# Patient Record
Sex: Female | Born: 1985
Health system: Southern US, Community
[De-identification: ages and names within clinical notes are randomized; demographics above are authoritative.]

## PROBLEM LIST (undated history)

## (undated) DIAGNOSIS — O039 Complete or unspecified spontaneous abortion without complication: Secondary | ICD-10-CM

## (undated) DIAGNOSIS — I341 Nonrheumatic mitral (valve) prolapse: Secondary | ICD-10-CM

## (undated) DIAGNOSIS — N809 Endometriosis, unspecified: Secondary | ICD-10-CM

## (undated) DIAGNOSIS — F419 Anxiety disorder, unspecified: Secondary | ICD-10-CM

## (undated) DIAGNOSIS — R011 Cardiac murmur, unspecified: Secondary | ICD-10-CM

## (undated) DIAGNOSIS — IMO0002 Reserved for concepts with insufficient information to code with codable children: Secondary | ICD-10-CM

## (undated) DIAGNOSIS — R87619 Unspecified abnormal cytological findings in specimens from cervix uteri: Secondary | ICD-10-CM

## (undated) HISTORY — DX: Endometriosis, unspecified: N80.9

## (undated) HISTORY — PX: WISDOM TOOTH EXTRACTION: SHX21

## (undated) HISTORY — DX: Nonrheumatic mitral (valve) prolapse: I34.1

## (undated) HISTORY — DX: Reserved for concepts with insufficient information to code with codable children: IMO0002

## (undated) HISTORY — DX: Anxiety disorder, unspecified: F41.9

## (undated) HISTORY — DX: Cardiac murmur, unspecified: R01.1

## (undated) HISTORY — DX: Complete or unspecified spontaneous abortion without complication: O03.9

## (undated) HISTORY — DX: Unspecified abnormal cytological findings in specimens from cervix uteri: R87.619

## (undated) HISTORY — PX: CERVICAL BIOPSY  W/ LOOP ELECTRODE EXCISION: SUR135

---
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
## 2010-11-25 ENCOUNTER — Inpatient Hospital Stay (INDEPENDENT_AMBULATORY_CARE_PROVIDER_SITE_OTHER)
Admission: RE | Admit: 2010-11-25 | Discharge: 2010-11-25 | Disposition: A | Discharge status: Home / Self Care | Payer: Federal, State, Local not specified - PPO | Source: Ambulatory Visit | Attending: Family Medicine | Admitting: Family Medicine

## 2010-11-25 DIAGNOSIS — T6391XA Toxic effect of contact with unspecified venomous animal, accidental (unintentional), initial encounter: Secondary | ICD-10-CM

## 2011-05-23 NOTE — L&D Delivery Note (Signed)
Delivery Note  Pt continued pushing well w good progress, FHR remained reassuring w early variables.   At 4:54 AM a viable female was delivered via Vaginal, Spontaneous Delivery (Presentation: ; Occiput Anterior).  Shoulders delivered easily, no respiratory effort, cord immediately clamped and cut and infant taken to warmer, NICU team called to room. APGAR: 1, 6; weight 8 lb 7.9 oz (3853 g).   Placenta status: Intact, Spontaneous.  Cord: 3 vessels with the following complications: None.  Cord pH: 7.211, 7.110   Anesthesia: Epidural, Local  Episiotomy: None Lacerations: 2nd degree Suture Repair: 3.0 vicryl rapide Est. Blood Loss (mL): 250cc   Mom to postpartum.  Baby to NICU. Pt plans to BF   Koleson Reifsteck M 04/11/2012, 6:08 AM

## 2011-08-07 ENCOUNTER — Encounter (INDEPENDENT_AMBULATORY_CARE_PROVIDER_SITE_OTHER): Payer: Federal, State, Local not specified - PPO | Admitting: Obstetrics and Gynecology

## 2011-08-07 DIAGNOSIS — Z01419 Encounter for gynecological examination (general) (routine) without abnormal findings: Secondary | ICD-10-CM

## 2011-08-07 DIAGNOSIS — Z202 Contact with and (suspected) exposure to infections with a predominantly sexual mode of transmission: Secondary | ICD-10-CM

## 2011-08-07 DIAGNOSIS — O09299 Supervision of pregnancy with other poor reproductive or obstetric history, unspecified trimester: Secondary | ICD-10-CM

## 2011-08-07 DIAGNOSIS — N912 Amenorrhea, unspecified: Secondary | ICD-10-CM

## 2011-08-09 ENCOUNTER — Other Ambulatory Visit (INDEPENDENT_AMBULATORY_CARE_PROVIDER_SITE_OTHER): Payer: Federal, State, Local not specified - PPO

## 2011-08-09 DIAGNOSIS — Z3480 Encounter for supervision of other normal pregnancy, unspecified trimester: Secondary | ICD-10-CM

## 2011-08-14 ENCOUNTER — Ambulatory Visit: Payer: Self-pay | Admitting: Obstetrics and Gynecology

## 2011-08-17 ENCOUNTER — Encounter: Payer: Federal, State, Local not specified - PPO | Attending: Obstetrics and Gynecology | Admitting: Dietician

## 2011-08-17 ENCOUNTER — Encounter (INDEPENDENT_AMBULATORY_CARE_PROVIDER_SITE_OTHER): Payer: Federal, State, Local not specified - PPO

## 2011-08-17 DIAGNOSIS — Z713 Dietary counseling and surveillance: Secondary | ICD-10-CM | POA: Insufficient documentation

## 2011-08-17 DIAGNOSIS — Z331 Pregnant state, incidental: Secondary | ICD-10-CM

## 2011-08-17 DIAGNOSIS — E639 Nutritional deficiency, unspecified: Secondary | ICD-10-CM | POA: Insufficient documentation

## 2011-08-17 DIAGNOSIS — O99891 Other specified diseases and conditions complicating pregnancy: Secondary | ICD-10-CM | POA: Insufficient documentation

## 2011-08-17 LAB — OB RESULTS CONSOLE HEPATITIS B SURFACE ANTIGEN: Hepatitis B Surface Ag: NEGATIVE

## 2011-08-17 LAB — OB RESULTS CONSOLE GC/CHLAMYDIA
Chlamydia: NEGATIVE
Gonorrhea: NEGATIVE

## 2011-08-17 LAB — OB RESULTS CONSOLE RUBELLA ANTIBODY, IGM: Rubella: IMMUNE

## 2011-08-17 LAB — OB RESULTS CONSOLE ANTIBODY SCREEN: Antibody Screen: NEGATIVE

## 2011-08-17 LAB — OB RESULTS CONSOLE RPR: RPR: NONREACTIVE

## 2011-08-17 LAB — OB RESULTS CONSOLE HGB/HCT, BLOOD: HCT: 47 %

## 2011-08-17 LAB — OB RESULTS CONSOLE HIV ANTIBODY (ROUTINE TESTING): HIV: NONREACTIVE

## 2011-08-17 NOTE — Patient Instructions (Addendum)
   Try a small amount of juice in water to help with taste  Use the bland and salty,(tortilla chips, the gold fish).  Use foods that sound like they would taste good.  Try for small frequent snacks  Try some dry roasted nuts, continue the yogurt, try the pudding in a small amount.   Try for some lower impact exercise each day.  Listen to your body and let it direct your exercise.  Continue to aim for at least 1800-2000 calories when you are able to eat.  Remember that you can loose the weight when the baby comes.  Use a protein source such as cheese, nuts, chicken, [place].  Use the regular salad dressings.  Fat in first or second trimester, but hold back on fat in the 3rd.

## 2011-08-17 NOTE — Progress Notes (Signed)
Medical Nutrition Therapy:  Appt start time: 1500 end time:  1600.  Assessment:  Primary concerns today: Currently is [redacted] weeks pregnant following a miscarriage in 03/2011.  She is in [employer] and is stationed in [place].  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  She reports that she has always tended to weight more and to maintain a BMI at a high normal.  While in service, she has tried to stay physically active and to keep her BMI well below the 25 kg/m2.  Now that she is pregnant, she is unsure what the service Amanda [name] say.  She is striving to maintain a healthy weight and have a healthy baby, but not gain a great deal of weight during the pregnancy, that she will need to loose following the delivery.  She is currently experiencing daily bouts of nausea and some vomiting.  Her eating patterns have changed a great deal.  Many of her usual foods are unappealing at this time.  She is experiencing a marked decrease in her energy levels and is frustrated with all of the many changes at one time.  We spent sometime discussing the changes that are normal to the first trimester and how the pregnancy Dmarius Reeder flow and how each woman can have her own unique physical responses to the pregnancy.  MEDICATIONS: Reconciliation of medications completed  DIETARY INTAKE:  24-hr recall:  B (6:00 AM): Banana on the way to work.  8:00 Oatmeal, instant packet, and cup of fruit (packaged) packed in juice or clementine.  Water or green tea. On the way to work a PB and jelly sandwich . And using the PB sandwich rather than the oatmeal. Snk (mid AM) : Special K pastry (100 calories) and water   L (12-1:00 PM): Soup chicken noodle (plain) or a [place] sandwich.  Clementine or fruit pack. Or all natural fruit bar at 70 calories  Water.  Snk (mid PM): 100 calorie pack of cookies or crackers. D (7:00 PM): Chicken or steak, or vegetable (broccoli, corn, green beans, or baked beans) 1-2 servings of starch.  Snk (Bedtime  PM): Fruit bar Beverages: water, green tea  Recent physical activity: spin class 2 times per week, kick boxing class 1 time per week and running 3 times per week but, this has not happened in the last couple of weeks. She is finding that her energy level is markedly decreased and the nausea is also limiting her activity.    Estimated energy needs: Ht;  65.5 in  WT: 142 lb (64.5 Kg)  BMI:23.3 kg/m2   1800-2000 calories At this point with the nausea, she needs all that her body will allow her to take in.  Progress Towards Goal(s):  In progress.   Nutritional Diagnosis:  NB-1.1 Food and nutrition-related knowledge deficit As related to the nurtritional needs for pregnancy .  As evidenced by initially in pregnancy limiting energy intake and maintaining exercise increased exercise levels, and currently experiencing increased nausea and decreased appetiti.  Intervention:  Nutrition We completed a review of the normal nutrient needs for pregnancy and followed with a review of how the appetite changes with the nausea and vomiting.  We reviewed strategies for keeping her energy intake up while dealing with the nausea.  She had a number of questions, and appeared to be seeking assurance that she was using the safest and best approaches to her current situation.  Handouts given during visit include:  Eating Expectantly  List of the Non-Starchy vegetables  Handout for Nausea During Pregnancy  Patient Instructions:  Try a small amount of juice in water to help with taste  Use the bland and salty,(tortilla chips, the gold fish).  Use foods that sound like they would taste good.  Try for small frequent snacks  Try some dry roasted nuts, continue the yogurt, try the pudding in a small amount.   Try for some lower impact exercise each day.  Listen to your body and let it direct your exercise.  Continue to aim for at least 1800-2000 calories when you are able to eat.  Remember that you can  loose the weight when the baby comes.  Use a protein source such as cheese, nuts, chicken, [place].  Use the regular salad dressings.  Fat in first or second trimester, but hold back on fat in the 3rd.  Monitoring/Evaluation:  Dietary intake, exercise, and body weight in mid-second trimester.  She is to call or e-mail with questions and make a follow-up appointment.

## 2011-08-22 ENCOUNTER — Encounter: Payer: Self-pay | Admitting: Dietician

## 2011-08-23 ENCOUNTER — Encounter (INDEPENDENT_AMBULATORY_CARE_PROVIDER_SITE_OTHER): Payer: Federal, State, Local not specified - PPO | Admitting: Obstetrics and Gynecology

## 2011-08-23 ENCOUNTER — Other Ambulatory Visit (INDEPENDENT_AMBULATORY_CARE_PROVIDER_SITE_OTHER): Payer: Federal, State, Local not specified - PPO

## 2011-08-23 DIAGNOSIS — Z331 Pregnant state, incidental: Secondary | ICD-10-CM

## 2011-09-20 ENCOUNTER — Encounter: Payer: Self-pay | Admitting: Obstetrics and Gynecology

## 2011-09-20 ENCOUNTER — Ambulatory Visit (INDEPENDENT_AMBULATORY_CARE_PROVIDER_SITE_OTHER): Payer: Federal, State, Local not specified - PPO | Admitting: Obstetrics and Gynecology

## 2011-09-20 VITALS — BP 100/66 | Wt 145.0 lb

## 2011-09-20 DIAGNOSIS — Z331 Pregnant state, incidental: Secondary | ICD-10-CM | POA: Insufficient documentation

## 2011-09-20 NOTE — Progress Notes (Signed)
Complains of vaginal itching.  Wet prep shows yeast.  Monistat recommended.  Genetic screening discussed.  Patient wants first trimester screen.  Return to office in 1 week.  Dr. Stefano Gaul

## 2011-09-20 NOTE — Progress Notes (Signed)
PT C/O ITCHING AND BURNING X 2 DAYS ? YEAST  LM

## 2011-09-27 ENCOUNTER — Encounter: Payer: Self-pay | Admitting: Obstetrics and Gynecology

## 2011-09-27 DIAGNOSIS — N939 Abnormal uterine and vaginal bleeding, unspecified: Secondary | ICD-10-CM

## 2011-09-28 ENCOUNTER — Encounter: Payer: Federal, State, Local not specified - PPO | Admitting: Obstetrics and Gynecology

## 2011-09-29 ENCOUNTER — Other Ambulatory Visit: Payer: Self-pay | Admitting: Obstetrics and Gynecology

## 2011-09-29 ENCOUNTER — Ambulatory Visit (INDEPENDENT_AMBULATORY_CARE_PROVIDER_SITE_OTHER): Payer: Federal, State, Local not specified - PPO

## 2011-09-29 DIAGNOSIS — Z331 Pregnant state, incidental: Secondary | ICD-10-CM

## 2011-10-03 ENCOUNTER — Ambulatory Visit (INDEPENDENT_AMBULATORY_CARE_PROVIDER_SITE_OTHER): Payer: Federal, State, Local not specified - PPO | Admitting: Obstetrics and Gynecology

## 2011-10-03 VITALS — BP 100/60 | Wt 144.0 lb

## 2011-10-03 DIAGNOSIS — Z331 Pregnant state, incidental: Secondary | ICD-10-CM

## 2011-10-03 NOTE — Progress Notes (Signed)
The patient reports vaginal itching.  She was nauseated okay to use Monistat on the vulva and vagina.  The patient was told that it is appropriate.  Ultrasound on Sep 29, 2011 showed a 12 week and 6 day gestation with a normal first trimester screen.  Return to office in 4 weeks.  Dr. Stefano Gaul

## 2011-10-03 NOTE — Progress Notes (Signed)
Pt still thinks she may have yeast infection with vaginal itching

## 2011-11-01 ENCOUNTER — Encounter: Payer: Self-pay | Admitting: Obstetrics and Gynecology

## 2011-11-01 ENCOUNTER — Ambulatory Visit (INDEPENDENT_AMBULATORY_CARE_PROVIDER_SITE_OTHER): Payer: Federal, State, Local not specified - PPO | Admitting: Obstetrics and Gynecology

## 2011-11-01 VITALS — BP 96/54 | Wt 147.0 lb

## 2011-11-01 DIAGNOSIS — Z331 Pregnant state, incidental: Secondary | ICD-10-CM

## 2011-11-01 DIAGNOSIS — Z139 Encounter for screening, unspecified: Secondary | ICD-10-CM

## 2011-11-01 NOTE — Progress Notes (Signed)
Pt without complaint today

## 2011-11-01 NOTE — Progress Notes (Signed)
Doing well. Declines genetic testing. Ultrasound next visit. AVS

## 2011-11-01 NOTE — Addendum Note (Signed)
Addended by: Janine Limbo on: 11/01/2011 05:23 PM   Modules accepted: Orders

## 2011-11-15 ENCOUNTER — Ambulatory Visit (INDEPENDENT_AMBULATORY_CARE_PROVIDER_SITE_OTHER): Payer: Federal, State, Local not specified - PPO | Admitting: Obstetrics and Gynecology

## 2011-11-15 ENCOUNTER — Ambulatory Visit (INDEPENDENT_AMBULATORY_CARE_PROVIDER_SITE_OTHER): Payer: Federal, State, Local not specified - PPO

## 2011-11-15 ENCOUNTER — Encounter: Payer: Self-pay | Admitting: Obstetrics and Gynecology

## 2011-11-15 VITALS — BP 120/58 | Wt 150.0 lb

## 2011-11-15 DIAGNOSIS — Z331 Pregnant state, incidental: Secondary | ICD-10-CM

## 2011-11-15 DIAGNOSIS — Z3689 Encounter for other specified antenatal screening: Secondary | ICD-10-CM

## 2011-11-15 DIAGNOSIS — Z139 Encounter for screening, unspecified: Secondary | ICD-10-CM

## 2011-11-15 NOTE — Progress Notes (Signed)
No complaints

## 2011-11-15 NOTE — Progress Notes (Signed)
Doing well. Ultrasound: Single gestation, normal fluid, normal anatomy, female, cervix 3.91 cm, posterior placenta. Return to office in 4 weeks. Dr. Stefano Gaul

## 2011-11-21 LAB — US OB COMP + 14 WK

## 2011-12-14 ENCOUNTER — Ambulatory Visit (INDEPENDENT_AMBULATORY_CARE_PROVIDER_SITE_OTHER): Payer: Federal, State, Local not specified - PPO | Admitting: Obstetrics and Gynecology

## 2011-12-14 VITALS — BP 100/54 | Wt 154.0 lb

## 2011-12-14 DIAGNOSIS — Z331 Pregnant state, incidental: Secondary | ICD-10-CM

## 2011-12-14 NOTE — Progress Notes (Signed)
Doing well. Return to office in 4 weeks. Glucola next visit. Dr. Stefano Gaul

## 2011-12-14 NOTE — Progress Notes (Signed)
Pt states she has no concerns today.  

## 2012-01-05 ENCOUNTER — Ambulatory Visit (INDEPENDENT_AMBULATORY_CARE_PROVIDER_SITE_OTHER): Payer: Federal, State, Local not specified - PPO | Admitting: Obstetrics and Gynecology

## 2012-01-05 ENCOUNTER — Encounter: Payer: Self-pay | Admitting: Obstetrics and Gynecology

## 2012-01-05 ENCOUNTER — Telehealth: Payer: Self-pay | Admitting: Obstetrics and Gynecology

## 2012-01-05 VITALS — BP 114/68 | Wt 156.0 lb

## 2012-01-05 DIAGNOSIS — Z3490 Encounter for supervision of normal pregnancy, unspecified, unspecified trimester: Secondary | ICD-10-CM

## 2012-01-05 DIAGNOSIS — R0602 Shortness of breath: Secondary | ICD-10-CM

## 2012-01-05 DIAGNOSIS — D649 Anemia, unspecified: Secondary | ICD-10-CM

## 2012-01-05 DIAGNOSIS — O368190 Decreased fetal movements, unspecified trimester, not applicable or unspecified: Secondary | ICD-10-CM

## 2012-01-05 DIAGNOSIS — Z331 Pregnant state, incidental: Secondary | ICD-10-CM

## 2012-01-05 LAB — CBC
HCT: 37.9 % (ref 36.0–46.0)
MCH: 31.2 pg (ref 26.0–34.0)
MCHC: 35.4 g/dL (ref 30.0–36.0)
MCV: 88.1 fL (ref 78.0–100.0)
Platelets: 372 10*3/uL (ref 150–400)
RDW: 13.1 % (ref 11.5–15.5)
WBC: 13.2 10*3/uL — ABNORMAL HIGH (ref 4.0–10.5)

## 2012-01-05 MED ORDER — FERRALET 90 90-1 MG PO TABS: 1.0000 | ORAL_TABLET | Freq: Every day | ORAL | Status: DC

## 2012-01-05 NOTE — Telephone Encounter (Signed)
Spoke with pt rgd phone call. Pt stated that she is [redacted]w[redacted]d pregnant . Pt stated has not been felling well for the past 2 days. Pt stated that her heart been feeling that it been beating very fast/ shortness of breath and numbness in her hands  Feeling nauseous and faint  .+ fm ,16fever,0 edema.Pt's next app is on 01/11/2012 with AVS advised pt would consult with the midwife oncall and would call pt back . Pt's voice understanding .

## 2012-01-05 NOTE — Progress Notes (Signed)
[redacted]w[redacted]d Patient has called early complaining that she had not felt the baby move and she was breathless on exertion. NST:155 bpm Cat 1, FM++ noted           Had uterine irritability and after hydration settled.           Advised re hydration.           Anemia in the past. CBC pending.           To commence on Floridix Liquid Iron BID po.            ROB in 2 weeks or as needed.

## 2012-01-05 NOTE — Progress Notes (Signed)
Pt declines 1 gtt today not feeling well C/o DEC FM

## 2012-01-05 NOTE — Progress Notes (Signed)
Pt c/o SOB and feeling faint x 3 days

## 2012-01-11 ENCOUNTER — Other Ambulatory Visit: Payer: Federal, State, Local not specified - PPO

## 2012-01-11 ENCOUNTER — Encounter: Payer: Self-pay | Admitting: Obstetrics and Gynecology

## 2012-01-11 ENCOUNTER — Ambulatory Visit (INDEPENDENT_AMBULATORY_CARE_PROVIDER_SITE_OTHER): Payer: Federal, State, Local not specified - PPO | Admitting: Obstetrics and Gynecology

## 2012-01-11 VITALS — BP 100/68 | Wt 160.0 lb

## 2012-01-11 DIAGNOSIS — Z331 Pregnant state, incidental: Secondary | ICD-10-CM

## 2012-01-11 NOTE — Progress Notes (Signed)
Pt states no concerns today.   

## 2012-01-11 NOTE — Progress Notes (Signed)
[redacted]w[redacted]d Glucola, hemoglobin, RPR today. Childbirth classes, breast feeding, contraception, car seat, preterm labor discussed. A positive. Return office in 2 weeks. Dr. Stefano Gaul

## 2012-01-12 LAB — GLUCOSE TOLERANCE, 1 HOUR (50G) W/O FASTING: Glucose, 1 Hour GTT: 107 mg/dL (ref 70–140)

## 2012-01-25 ENCOUNTER — Ambulatory Visit (INDEPENDENT_AMBULATORY_CARE_PROVIDER_SITE_OTHER): Payer: Federal, State, Local not specified - PPO | Admitting: Obstetrics and Gynecology

## 2012-01-25 VITALS — BP 98/68 | Wt 164.0 lb

## 2012-01-25 DIAGNOSIS — Z3490 Encounter for supervision of normal pregnancy, unspecified, unspecified trimester: Secondary | ICD-10-CM

## 2012-01-25 DIAGNOSIS — Z331 Pregnant state, incidental: Secondary | ICD-10-CM

## 2012-01-25 NOTE — Progress Notes (Signed)
[redacted]w[redacted]d Had been feeling unwell at last visit and was diagnosed with anemia. Completed 2 weeks of Floridix Iron Supplement po. Feeling well with return of energy levels. FM+ No change vaginal secretions. ROB x 2 weeks

## 2012-02-08 ENCOUNTER — Ambulatory Visit (INDEPENDENT_AMBULATORY_CARE_PROVIDER_SITE_OTHER): Payer: Federal, State, Local not specified - PPO | Admitting: Obstetrics and Gynecology

## 2012-02-08 VITALS — BP 100/62 | Wt 167.0 lb

## 2012-02-08 DIAGNOSIS — Z331 Pregnant state, incidental: Secondary | ICD-10-CM

## 2012-02-08 NOTE — Progress Notes (Signed)
[redacted]w[redacted]d doing well. Return office in 2 weeks. Dr. Stefano Gaul

## 2012-02-21 ENCOUNTER — Ambulatory Visit (INDEPENDENT_AMBULATORY_CARE_PROVIDER_SITE_OTHER): Payer: Federal, State, Local not specified - PPO | Admitting: Obstetrics and Gynecology

## 2012-02-21 VITALS — BP 102/60 | Wt 167.0 lb

## 2012-02-21 DIAGNOSIS — Z331 Pregnant state, incidental: Secondary | ICD-10-CM

## 2012-02-21 NOTE — Progress Notes (Signed)
[redacted]w[redacted]d Doing well. Return to office in 2 weeks. Dr. Stefano Gaul

## 2012-02-21 NOTE — Progress Notes (Signed)
[redacted]w[redacted]d  Pt has no concerns today.

## 2012-03-06 ENCOUNTER — Ambulatory Visit (INDEPENDENT_AMBULATORY_CARE_PROVIDER_SITE_OTHER): Payer: Federal, State, Local not specified - PPO | Admitting: Obstetrics and Gynecology

## 2012-03-06 VITALS — BP 104/76 | Wt 171.0 lb

## 2012-03-06 DIAGNOSIS — Z331 Pregnant state, incidental: Secondary | ICD-10-CM

## 2012-03-06 NOTE — Progress Notes (Signed)
[redacted]w[redacted]d Beta strep sent. Return office in 1 week. Dr. Stefano Gaul

## 2012-03-06 NOTE — Progress Notes (Signed)
[redacted]w[redacted]d  Pt has no concerns today. GBS & GC/CT today.

## 2012-03-14 ENCOUNTER — Ambulatory Visit (INDEPENDENT_AMBULATORY_CARE_PROVIDER_SITE_OTHER): Payer: Federal, State, Local not specified - PPO | Admitting: Obstetrics and Gynecology

## 2012-03-14 VITALS — BP 102/64 | Wt 173.0 lb

## 2012-03-14 DIAGNOSIS — Z23 Encounter for immunization: Secondary | ICD-10-CM

## 2012-03-14 DIAGNOSIS — Z331 Pregnant state, incidental: Secondary | ICD-10-CM

## 2012-03-14 NOTE — Progress Notes (Addendum)
[redacted]w[redacted]d Getting tired. Flu shot today. Return to office in 1 week. Dr. Stefano Gaul

## 2012-03-14 NOTE — Progress Notes (Signed)
[redacted]w[redacted]d  GBS :Negative  Pt states she has no concerns today.  Pt desires cervix check today.

## 2012-03-20 ENCOUNTER — Encounter: Payer: Self-pay | Admitting: Obstetrics and Gynecology

## 2012-03-20 ENCOUNTER — Ambulatory Visit (INDEPENDENT_AMBULATORY_CARE_PROVIDER_SITE_OTHER): Payer: Federal, State, Local not specified - PPO | Admitting: Obstetrics and Gynecology

## 2012-03-20 VITALS — BP 110/74 | Wt 175.0 lb

## 2012-03-20 DIAGNOSIS — Z331 Pregnant state, incidental: Secondary | ICD-10-CM

## 2012-03-20 NOTE — Patient Instructions (Signed)

## 2012-03-20 NOTE — Progress Notes (Signed)
Pt states she has increase in discharge, requests cervix check today.

## 2012-03-20 NOTE — Progress Notes (Signed)
[redacted]w[redacted]d A/P GBS negative Fetal kick counts reviewed Labor reviewed with pt All patients  questions answered  

## 2012-03-27 ENCOUNTER — Ambulatory Visit (INDEPENDENT_AMBULATORY_CARE_PROVIDER_SITE_OTHER): Payer: Federal, State, Local not specified - PPO | Admitting: Obstetrics and Gynecology

## 2012-03-27 VITALS — BP 120/70 | Wt 175.0 lb

## 2012-03-27 DIAGNOSIS — Z331 Pregnant state, incidental: Secondary | ICD-10-CM

## 2012-03-27 NOTE — Progress Notes (Signed)
[redacted]w[redacted]d Doing well. Return office in 1 week. Dr. Masyn Fullam 

## 2012-04-01 ENCOUNTER — Telehealth (HOSPITAL_COMMUNITY): Payer: Self-pay | Admitting: *Deleted

## 2012-04-01 ENCOUNTER — Encounter (HOSPITAL_COMMUNITY): Payer: Self-pay | Admitting: *Deleted

## 2012-04-01 NOTE — Telephone Encounter (Signed)
Preadmission screen  

## 2012-04-02 ENCOUNTER — Ambulatory Visit (INDEPENDENT_AMBULATORY_CARE_PROVIDER_SITE_OTHER): Payer: Federal, State, Local not specified - PPO | Admitting: Obstetrics and Gynecology

## 2012-04-02 VITALS — BP 120/72 | Wt 176.0 lb

## 2012-04-02 DIAGNOSIS — Z3A41 41 weeks gestation of pregnancy: Secondary | ICD-10-CM

## 2012-04-02 DIAGNOSIS — O480 Post-term pregnancy: Secondary | ICD-10-CM

## 2012-04-02 DIAGNOSIS — Z331 Pregnant state, incidental: Secondary | ICD-10-CM

## 2012-04-02 NOTE — Progress Notes (Signed)
[redacted]w[redacted]d No complaints today Cervix check

## 2012-04-02 NOTE — Progress Notes (Signed)
[redacted]w[redacted]d Return to office in 1 week. Ultrasound for growth and BPP next week. Dr. Stefano Gaul

## 2012-04-08 ENCOUNTER — Encounter: Payer: Self-pay | Admitting: Obstetrics and Gynecology

## 2012-04-08 ENCOUNTER — Ambulatory Visit (INDEPENDENT_AMBULATORY_CARE_PROVIDER_SITE_OTHER): Payer: Federal, State, Local not specified - PPO

## 2012-04-08 ENCOUNTER — Ambulatory Visit (INDEPENDENT_AMBULATORY_CARE_PROVIDER_SITE_OTHER): Payer: Federal, State, Local not specified - PPO | Admitting: Obstetrics and Gynecology

## 2012-04-08 VITALS — BP 120/72 | Wt 178.0 lb

## 2012-04-08 DIAGNOSIS — Z331 Pregnant state, incidental: Secondary | ICD-10-CM

## 2012-04-08 DIAGNOSIS — O480 Post-term pregnancy: Secondary | ICD-10-CM

## 2012-04-08 DIAGNOSIS — Z3A41 41 weeks gestation of pregnancy: Secondary | ICD-10-CM

## 2012-04-08 NOTE — Progress Notes (Signed)
[redacted]w[redacted]d EFW 8lbs 14 oz AFI NRL 65th% BPP 8/8

## 2012-04-08 NOTE — Progress Notes (Signed)
Ultrasound shows:  SIUP  S=D     [place] EDD: 04/01/12           EFW: 8 lb 14 oz            AFI: 14.06cm    65th%           Cervical length: n/a            Placenta localization: posterior           Fetal presentation: vertex BPP 8/8

## 2012-04-08 NOTE — Progress Notes (Signed)
[redacted]w[redacted]d  GFM  Some cramping with increased d/c  Induction discussed R&B: scheduled 04/09/12 discussed with CNM

## 2012-04-09 ENCOUNTER — Inpatient Hospital Stay (HOSPITAL_COMMUNITY)
Admission: AD | Admit: 2012-04-09 | Discharge: 2012-04-13 | DRG: 372 | Disposition: A | Discharge status: Home / Self Care | Payer: Federal, State, Local not specified - PPO | Source: Ambulatory Visit | Attending: Obstetrics and Gynecology | Admitting: Obstetrics and Gynecology

## 2012-04-09 DIAGNOSIS — I2510 Atherosclerotic heart disease of native coronary artery without angina pectoris: Secondary | ICD-10-CM | POA: Diagnosis present

## 2012-04-09 DIAGNOSIS — D649 Anemia, unspecified: Secondary | ICD-10-CM | POA: Diagnosis not present

## 2012-04-09 DIAGNOSIS — O9942 Diseases of the circulatory system complicating childbirth: Secondary | ICD-10-CM | POA: Diagnosis present

## 2012-04-09 DIAGNOSIS — I341 Nonrheumatic mitral (valve) prolapse: Secondary | ICD-10-CM

## 2012-04-09 DIAGNOSIS — O480 Post-term pregnancy: Secondary | ICD-10-CM | POA: Diagnosis present

## 2012-04-09 DIAGNOSIS — O9903 Anemia complicating the puerperium: Secondary | ICD-10-CM | POA: Diagnosis not present

## 2012-04-09 DIAGNOSIS — I059 Rheumatic mitral valve disease, unspecified: Secondary | ICD-10-CM | POA: Diagnosis present

## 2012-04-09 DIAGNOSIS — Z9889 Other specified postprocedural states: Secondary | ICD-10-CM | POA: Insufficient documentation

## 2012-04-09 HISTORY — DX: Nonrheumatic mitral (valve) prolapse: I34.1

## 2012-04-09 LAB — CBC
Hemoglobin: 12.8 g/dL (ref 12.0–15.0)
MCH: 31.2 pg (ref 26.0–34.0)
MCHC: 35 g/dL (ref 30.0–36.0)
Platelets: 301 10*3/uL (ref 150–400)
RBC: 4.1 MIL/uL (ref 3.87–5.11)

## 2012-04-09 LAB — US OB FOLLOW UP

## 2012-04-09 MED ORDER — ONDANSETRON HCL 4 MG/2ML IJ SOLN: 4.0000 mg | Freq: Four times a day (QID) | INTRAMUSCULAR | Status: DC | PRN

## 2012-04-09 MED ORDER — IBUPROFEN 600 MG PO TABS
600.0000 mg | ORAL_TABLET | Freq: Four times a day (QID) | ORAL | Status: DC | PRN
  Administered 2012-04-11: 600 mg via ORAL
  Filled 2012-04-09: qty 1

## 2012-04-09 MED ORDER — LIDOCAINE HCL (PF) 1 % IJ SOLN
30.0000 mL | INTRAMUSCULAR | Status: DC | PRN
  Administered 2012-04-11: 30 mL via SUBCUTANEOUS
  Filled 2012-04-09: qty 30

## 2012-04-09 MED ORDER — OXYTOCIN 40 UNITS IN LACTATED RINGERS INFUSION - SIMPLE MED: 1.0000 m[IU]/min | INTRAVENOUS | Status: DC

## 2012-04-09 MED ORDER — SODIUM CHLORIDE 0.9 % IJ SOLN: 3.0000 mL | INTRAMUSCULAR | Status: DC | PRN

## 2012-04-09 MED ORDER — OXYCODONE-ACETAMINOPHEN 5-325 MG PO TABS: 1.0000 | ORAL_TABLET | ORAL | Status: DC | PRN

## 2012-04-09 MED ORDER — TERBUTALINE SULFATE 1 MG/ML IJ SOLN: 0.2500 mg | Freq: Once | INTRAMUSCULAR | Status: AC | PRN

## 2012-04-09 MED ORDER — ACETAMINOPHEN 325 MG PO TABS: 650.0000 mg | ORAL_TABLET | ORAL | Status: DC | PRN

## 2012-04-09 MED ORDER — LACTATED RINGERS IV SOLN
500.0000 mL | INTRAVENOUS | Status: DC | PRN
  Administered 2012-04-10: 500 mL via INTRAVENOUS

## 2012-04-09 MED ORDER — MISOPROSTOL 25 MCG QUARTER TABLET: 25.0000 ug | ORAL_TABLET | ORAL | Status: DC | PRN

## 2012-04-09 MED ORDER — SODIUM CHLORIDE 0.9 % IJ SOLN: 3.0000 mL | Freq: Two times a day (BID) | INTRAMUSCULAR | Status: DC

## 2012-04-09 MED ORDER — ZOLPIDEM TARTRATE 5 MG PO TABS
5.0000 mg | ORAL_TABLET | Freq: Every evening | ORAL | Status: DC | PRN
  Administered 2012-04-10: 5 mg via ORAL
  Filled 2012-04-09: qty 1

## 2012-04-09 MED ORDER — HYDROXYZINE HCL 50 MG/ML IM SOLN: 50.0000 mg | Freq: Four times a day (QID) | INTRAMUSCULAR | Status: DC | PRN

## 2012-04-09 MED ORDER — LACTATED RINGERS IV SOLN
INTRAVENOUS | Status: DC
  Administered 2012-04-09 – 2012-04-10 (×5): via INTRAVENOUS

## 2012-04-09 MED ORDER — SODIUM CHLORIDE 0.9 % IV SOLN: 250.0000 mL | INTRAVENOUS | Status: DC | PRN

## 2012-04-09 MED ORDER — FENTANYL CITRATE 0.05 MG/ML IJ SOLN: 100.0000 ug | INTRAMUSCULAR | Status: DC | PRN

## 2012-04-09 MED ORDER — OXYTOCIN 40 UNITS IN LACTATED RINGERS INFUSION - SIMPLE MED
1.0000 m[IU]/min | INTRAVENOUS | Status: DC
  Administered 2012-04-09: 1 m[IU]/min via INTRAVENOUS

## 2012-04-09 MED ORDER — OXYTOCIN 40 UNITS IN LACTATED RINGERS INFUSION - SIMPLE MED
62.5000 mL/h | INTRAVENOUS | Status: DC
  Administered 2012-04-11: 62.5 mL/h via INTRAVENOUS
  Filled 2012-04-09: qty 1000

## 2012-04-09 MED ORDER — CITRIC ACID-SODIUM CITRATE 334-500 MG/5ML PO SOLN
30.0000 mL | ORAL | Status: DC | PRN
  Administered 2012-04-09: 30 mL via ORAL
  Filled 2012-04-09: qty 15

## 2012-04-09 MED ORDER — HYDROXYZINE HCL 50 MG PO TABS: 50.0000 mg | ORAL_TABLET | Freq: Four times a day (QID) | ORAL | Status: DC | PRN

## 2012-04-09 MED ORDER — OXYTOCIN BOLUS FROM INFUSION: 500.0000 mL | INTRAVENOUS | Status: DC

## 2012-04-09 NOTE — H&P (Signed)
Amanda [name] is a 26 y.o. female, G2P0 at 64 1/7 weeks, presenting for induction of labor due to postdates.  Cervix on exam yesterday was closed, 50%.  She has therefore been scheduled for cervical ripening tonight and pitocin in the am.  Denies leaking or bleeding, reports +FM.  Patient Active Problem List  Diagnosis  . Pregnant state, incidental  . Abnormal bleeding in menstrual cycle  . MVP (mitral valve prolapse)  . S/P LEEP    History of present pregnancy: Patient entered care at 7 weeks.   EDC of 04/01/12 was established by LMP and was in agreement with 1st trimester screening US.   Anatomy scan:  20 weeks, with normal findings and an posterior placenta.   Additional [place] evaluations:  41 weeks, with EFW 8+14, AFI 14.06, 65%ile, vtx, BPP 8/8.   Significant prenatal events:  None   Last evaluation:  41 weeks, cervix closed, 50%, vtx.  OB History    Grav Para Term Preterm Abortions TAB SAB Ect Mult Living   2 0 0 0 1 0 1 0 0 0     2012--SAB at 10 weeks  Past Medical History  Diagnosis Date  . Miscarriage   . Abnormal Pap smear   . Endometriosis   . MVP (mitral valve prolapse)   . Heart murmur    Past Surgical History  Procedure Date  . Cervical biopsy  w/ loop electrode excision 2004  . Wisdom tooth extraction    Family History: family history includes Asthma in her sister; Cancer in her maternal grandmother and maternal uncle; Diabetes in her paternal grandfather; Heart disease in her father and paternal grandfather; Hyperlipidemia in her maternal grandfather and maternal grandmother; Hypertension in her maternal grandfather, mother, and paternal grandfather; and Obesity in her mother and sister.  Social History:  reports that she has never smoked. She has never used smokeless tobacco. She reports that she does not drink alcohol or use illicit drugs.  Husband, Amado Nash, is involved and supportive.   Prenatal Transfer Tool  Maternal Diabetes: No Genetic  Screening: Normal 1st trimester screen Maternal Ultrasounds/Referrals: Normal Fetal Ultrasounds or other Referrals:  None Maternal Substance Abuse:  No Significant Maternal Medications:  None Significant Maternal Lab Results: Lab values include: Group B Strep negative    ROS:  Occasional contractions, +FM.  No Known Allergies     There were no vitals taken for this visit.  Chest clear Heart RRR without murmur Abd gravid, NT, FH 39 cm Pelvic: cervix closed on exam 04/08/12, 50%, vtx, Ext: WNL  FHR: 150 by doppler in office UCs:  Occasional  Prenatal labs: ABO, Rh: A/Positive/-- (03/28 0000) Antibody: Negative (03/28 0000) Rubella:   Immune RPR: NON REAC (08/22 1620)  HBsAg: Negative (03/28 0000)  HIV: Non-reactive (03/28 0000)  GBS: NEGATIVE (10/16 1718) Sickle cell/Hgb electrophoresis:  NA Pap:  WNL 3/13 GC:  Negative 3/13 Chlamydia:  Negative 3/13  Genetic screenings:  Normal 1st trimester screen, declined AFP Glucola:  Normal Hgb 15.4 at NOB, 13.4 on 8/16, 12 on 8/22.    Assessment/Plan: IUP at 41 1/7 weeks GBS negative Unripe cervix  Plan: Admit to Birthing Suite per consult with Dr. Stefano Gaul. Routine [facility] orders Plan cytotech 25 mcg q 4 hour, then pitocin in am. Pain medication prn.  Nyra Capes, [place] 04/09/2012, 6:23 PM

## 2012-04-09 NOTE — Progress Notes (Signed)
Subjective: Pt admitted for IOL at [redacted]w[redacted]d.  No c/o's.  GFM.  No LOF or VB.  Accompanied by her husband, "[name]."  No PIH s/s.  Doesn't discern all ctxs monitor is tracing.  Objective: BP 129/82  Pulse 75  Temp 97.5 F (36.4 C) (Oral)  Resp 20  Ht 5\' 5"  (1.651 m)  Wt 178 lb (80.74 kg)  BMI 29.62 kg/m2     .Marland Kitchen Filed Vitals:   04/09/12 1918 04/09/12 2030  BP: 125/91 129/82  Pulse: 109 75  Temp: 97.5 F (36.4 C)   TempSrc: Oral   Resp: 20   Height: 5\' 5"  (1.651 m)   Weight: 178 lb (80.74 kg)    FHT:  FHR: 135 bpm, variability: moderate,  accelerations:  Present,  decelerations:  Absent UC:   regular, every 2-5 minutes SVE:   0.5/80/-1, mid position Labs: Lab Results  Component Value Date   WBC 12.1* 04/09/2012   HGB 12.8 04/09/2012   HCT 36.6 04/09/2012   MCV 89.3 04/09/2012   PLT 301 04/09/2012    Assessment / Plan: 1. [redacted]w[redacted]d 2. Induction for post term 3. GBS neg  Labor: induction Preeclampsia:  no signs or symptoms of toxicity Fetal Wellbeing:  Category I Pain Control:  Labor support without medications I/D:  n/a Anticipated MOD:  NSVD 1.  Disc'd induction agents and rec'd low-dose Pitocin secondary to frequent ctx pattern.  Will start at 1mu and increase slowly overnight.   Support as needed. 2. C/w MD prn.  Deaaron Fulghum H 04/09/2012, 8:48 PM

## 2012-04-10 ENCOUNTER — Inpatient Hospital Stay (HOSPITAL_COMMUNITY): Payer: Federal, State, Local not specified - PPO | Admitting: Anesthesiology

## 2012-04-10 ENCOUNTER — Encounter (HOSPITAL_COMMUNITY): Payer: Self-pay | Admitting: Anesthesiology

## 2012-04-10 DIAGNOSIS — O480 Post-term pregnancy: Secondary | ICD-10-CM | POA: Diagnosis present

## 2012-04-10 LAB — RPR: RPR Ser Ql: NONREACTIVE

## 2012-04-10 MED ORDER — DIPHENHYDRAMINE HCL 50 MG/ML IJ SOLN: 12.5000 mg | INTRAMUSCULAR | Status: DC | PRN

## 2012-04-10 MED ORDER — EPHEDRINE 5 MG/ML INJ: 10.0000 mg | INTRAVENOUS | Status: DC | PRN

## 2012-04-10 MED ORDER — LACTATED RINGERS IV SOLN
500.0000 mL | Freq: Once | INTRAVENOUS | Status: AC
  Administered 2012-04-10: 16:00:00 via INTRAVENOUS

## 2012-04-10 MED ORDER — OXYTOCIN 40 UNITS IN LACTATED RINGERS INFUSION - SIMPLE MED: 1.0000 m[IU]/min | INTRAVENOUS | Status: DC

## 2012-04-10 MED ORDER — FENTANYL 2.5 MCG/ML BUPIVACAINE 1/10 % EPIDURAL INFUSION (WH - ANES)
INTRAMUSCULAR | Status: DC | PRN
  Administered 2012-04-10: 14 mL/h via EPIDURAL

## 2012-04-10 MED ORDER — EPHEDRINE 5 MG/ML INJ
10.0000 mg | INTRAVENOUS | Status: DC | PRN
  Filled 2012-04-10: qty 4

## 2012-04-10 MED ORDER — PHENYLEPHRINE 40 MCG/ML (10ML) SYRINGE FOR IV PUSH (FOR BLOOD PRESSURE SUPPORT)
80.0000 ug | PREFILLED_SYRINGE | INTRAVENOUS | Status: DC | PRN
  Filled 2012-04-10: qty 5

## 2012-04-10 MED ORDER — TERBUTALINE SULFATE 1 MG/ML IJ SOLN: 0.2500 mg | Freq: Once | INTRAMUSCULAR | Status: AC | PRN

## 2012-04-10 MED ORDER — PHENYLEPHRINE 40 MCG/ML (10ML) SYRINGE FOR IV PUSH (FOR BLOOD PRESSURE SUPPORT): 80.0000 ug | PREFILLED_SYRINGE | INTRAVENOUS | Status: DC | PRN

## 2012-04-10 MED ORDER — FENTANYL 2.5 MCG/ML BUPIVACAINE 1/10 % EPIDURAL INFUSION (WH - ANES)
14.0000 mL/h | INTRAMUSCULAR | Status: DC
  Administered 2012-04-10: 14 mL/h via EPIDURAL
  Filled 2012-04-10 (×2): qty 125

## 2012-04-10 MED ORDER — LIDOCAINE HCL (PF) 1 % IJ SOLN
INTRAMUSCULAR | Status: DC | PRN
  Administered 2012-04-10 (×2): 8 mL

## 2012-04-10 NOTE — Progress Notes (Signed)
Subjective: Pt sleeping s/p ambien at [employer].  Husband remains at bedside.  Pitocin on 3mu.   Objective: BP 106/78  Pulse 82  Temp 97.5 F (36.4 C) (Oral)  Resp 16  Ht 5\' 5"  (1.651 m)  Wt 178 lb (80.74 kg)  BMI 29.62 kg/m2      FHT:  FHR: 120 bpm, variability: moderate,  accelerations:  Present,  decelerations:  Absent UC:   irregular, every 1.5-5 minutes SVE:   Dilation: Fingertip Effacement (%): 80 Station: -2 Exam by:: H.Emiah Pellicano,CNM  Labs: Lab Results  Component Value Date   WBC 12.1* 04/09/2012   HGB 12.8 04/09/2012   HCT 36.6 04/09/2012   MCV 89.3 04/09/2012   PLT 301 04/09/2012    Assessment / Plan: 1. IOL for post-term at [redacted]w[redacted]d 2. GBS neg   Labor: Low-dose pit overnight at for ripening, will titrate in AM per routine protocol Preeclampsia:  no signs or symptoms of toxicity Fetal Wellbeing:  Category I Pain Control:  Labor support without medications I/D:  n/a Anticipated MOD:  NSVD 1. Continue current POC; c/w MD prn  Shantina Chronister H 04/10/2012, 1:47 AM

## 2012-04-10 NOTE — Anesthesia Preprocedure Evaluation (Signed)

## 2012-04-10 NOTE — Progress Notes (Signed)
Patient ID: Amanda [name], female   DOB: February 19, 1986, 26 y.o.   MRN: [id number] .Subjective: Breathing through ctx, c/o back pain, family at bs, denies epidural at present    Objective: BP 121/90  Pulse 95  Temp 98.2 F (36.8 C) (Oral)  Resp 16  Ht 5\' 5"  (1.651 m)  Wt 178 lb (80.74 kg)  BMI 29.62 kg/m2   FHT:  FHR: 130 bpm, variability: moderate,  accelerations:  Present,  decelerations:  Absent UC:   irregular, every 2-4 minutes SVE:  Deferred, foley bulb in place    Assessment / Plan: induction for PD    Fetal Wellbeing:  Category I Pain Control:  Labor support without medications  Update physician PRN  Brixton Franko M 04/10/2012, 11:32 AM

## 2012-04-10 NOTE — Progress Notes (Signed)
Patient ID: Amanda [name], female   DOB: 1986/03/10, 26 y.o.   MRN: [id number] .Subjective:  Getting comfortable w epidural, still feeling some lower pelvic cramping, but much improved, foley catheter placed   Objective: BP 124/85  Pulse 143  Temp 98.1 F (36.7 C) (Oral)  Resp 18  Ht 5\' 5"  (1.651 m)  Wt 178 lb (80.74 kg)  BMI 29.62 kg/m2  SpO2 98%   FHT:  FHR: 140 bpm, variability: moderate,  accelerations:  Present,  decelerations:  Present variables UC:   regular, every 2 minutes SVE:   Dilation: 2.5 Effacement (%): 100 Station: 0 Exam by:: shelly Jannae Fagerstrom, cnm  Cervix w tight band, foley bulb removed,  AROM for clear fluid    Assessment / Plan: early labor Variables after AROM, resolving w position changes  Continue pitocin, IUPC prn   Fetal Wellbeing:  Category II Pain Control:  Epidural  Dr Pennie Rushing updated   Amanda [name] 04/10/2012, 6:49 PM

## 2012-04-10 NOTE — Progress Notes (Signed)
Patient ID: Amanda [name], female   DOB: 26-May-1985, 26 y.o.   MRN: [id number] .Subjective: Has rested some overnight, feeling some ctx now, mainly in back, had some bloody show earlier    Objective: BP 97/64  Pulse 84  Temp 98.2 F (36.8 C) (Oral)  Resp 16  Ht 5\' 5"  (1.651 m)  Wt 178 lb (80.74 kg)  BMI 29.62 kg/m2   FHT:  FHR: 130 bpm, variability: moderate,  accelerations:  Present,  decelerations:  Absent UC:   irregular, every 4-6 minutes SVE:   FT/90/-2 Pitocin at 5mu   Assessment / Plan: IOL for PD GBS neg Will place foley balloon and begin increasing pitocin     Fetal Wellbeing:  Category I Pain Control:  Labor support without medications, plans epidural  Anticipate NSVD   Update physician PRN  [name] 04/10/2012, 7:39 AM

## 2012-04-10 NOTE — Anesthesia Procedure Notes (Signed)
Epidural Patient location during procedure: OB Start time: 04/10/2012 5:29 PM End time: 04/10/2012 5:33 PM  Staffing Anesthesiologist: Sandrea Hughs Performed by: anesthesiologist   Preanesthetic Checklist Completed: patient identified, site marked, surgical consent, pre-op evaluation, timeout performed, IV checked, risks and benefits discussed and monitors and equipment checked  Epidural Patient position: sitting Prep: site prepped and draped and DuraPrep Patient monitoring: continuous pulse ox and blood pressure Approach: midline Injection technique: LOR air  Needle:  Needle type: Tuohy  Needle gauge: 17 G Needle length: 9 cm and 9 Needle insertion depth: 5 cm cm Catheter type: closed end flexible Catheter size: 19 Gauge Catheter at skin depth: 10 cm Test dose: negative and Other  Assessment Sensory level: T8 Events: blood not aspirated, injection not painful, no injection resistance, negative IV test and no paresthesia  Additional Notes Reason for block:procedure for pain

## 2012-04-10 NOTE — Progress Notes (Signed)
Patient ID: Amanda [name], female   DOB: 1985-08-15, 26 y.o.   MRN: [id number] .Subjective:  Sitting in chair at bs, kpad helped w back pain, coping well   Objective: BP 125/82  Pulse 60  Temp 97.8 F (36.6 C) (Oral)  Resp 18  Ht 5\' 5"  (1.651 m)  Wt 178 lb (80.74 kg)  BMI 29.62 kg/m2   FHT:  FHR: 140 bpm, variability: moderate,  accelerations:  Present,  decelerations:  Absent UC:   regular, every 2-3  minutes SVE:   Dilation: Fingertip Effacement (%): 90 Station: -2 Exam by:: S.Keigan Tafoya, CNM  VE deferred, foley bulb in place   Assessment / Plan: IOL for PD Not in active labor  Continue pitocin, plan AROM after foley bulb out     Fetal Wellbeing:  Category I Pain Control:  Labor support without medications  Update physician PRN  [name] 04/10/2012, 2:19 PM

## 2012-04-10 NOTE — Progress Notes (Signed)
Patient ID: Bryssa Tones, female   DOB: 1986-05-01, 26 y.o.   MRN: [id number] .Subjective:  Did have L sided pain, using epidural pca w good relief   Objective: BP 119/87  Pulse 87  Temp 98.2 F (36.8 C) (Oral)  Resp 16  Ht 5\' 5"  (1.651 m)  Wt 178 lb (80.74 kg)  BMI 29.62 kg/m2  SpO2 98%   FHT:  FHR: 140 bpm, variability: moderate,  accelerations:  Present,  decelerations:  Present variables UC:   regular, every 2 minutes SVE:   Dilation: 3 Effacement (%): 100 Station: 0 Exam by:: S.Lilliard,CNM  scar tissue still felt on cervix   IUPC placed without difficulty,    Assessment / Plan: induction for postdates Hx LEEP w scar tissue on cervix   Continue pitocin to titrate for adequate MVU's    Fetal Wellbeing:  Category II Pain Control:  Epidural  Update physician PRN  [name] 04/10/2012, 8:36 PM

## 2012-04-10 NOTE — Progress Notes (Signed)
Patient ID: Kelleen Stolze, female   DOB: [birth year], 26 y.o.   MRN: [id number] .Subjective:  Visibly more uncomfortable, Breathing w ctx, desires epidural  Objective: BP 111/98  Pulse 106  Temp 98.1 F (36.7 C) (Oral)  Resp 18  Ht 5\' 5"  (1.651 m)  Wt 178 lb (80.74 kg)  BMI 29.62 kg/m2   FHT:  FHR: 130 bpm, variability: moderate,  accelerations:  Present,  decelerations:  Absent UC:   regular, every 2-3  minutes SVE:   1/100/0   Foley balloon in place, vtx descended more,   Assessment / Plan: IOL for PD Early labor  Proceed w epidural  AROM, IUPC prn    Fetal Wellbeing:  Category I Pain Control:  Epidural  Update physician PRN  [name] 04/10/2012, 4:26 PM

## 2012-04-11 ENCOUNTER — Encounter (HOSPITAL_COMMUNITY): Payer: Self-pay | Admitting: *Deleted

## 2012-04-11 MED ORDER — ONDANSETRON HCL 4 MG/2ML IJ SOLN: 4.0000 mg | INTRAMUSCULAR | Status: DC | PRN

## 2012-04-11 MED ORDER — ONDANSETRON HCL 4 MG PO TABS: 4.0000 mg | ORAL_TABLET | ORAL | Status: DC | PRN

## 2012-04-11 MED ORDER — MEDROXYPROGESTERONE ACETATE 150 MG/ML IM SUSP: 150.0000 mg | INTRAMUSCULAR | Status: DC | PRN

## 2012-04-11 MED ORDER — BENZOCAINE-MENTHOL 20-0.5 % EX AERO
1.0000 "application " | INHALATION_SPRAY | CUTANEOUS | Status: DC | PRN
  Administered 2012-04-11: 1 via TOPICAL
  Filled 2012-04-11: qty 56

## 2012-04-11 MED ORDER — DIPHENHYDRAMINE HCL 25 MG PO CAPS: 25.0000 mg | ORAL_CAPSULE | Freq: Four times a day (QID) | ORAL | Status: DC | PRN

## 2012-04-11 MED ORDER — MEASLES, MUMPS & RUBELLA VAC ~~LOC~~ INJ: 0.5000 mL | INJECTION | Freq: Once | SUBCUTANEOUS | Status: DC

## 2012-04-11 MED ORDER — DIBUCAINE 1 % RE OINT: 1.0000 "application " | TOPICAL_OINTMENT | RECTAL | Status: DC | PRN

## 2012-04-11 MED ORDER — MAGNESIUM HYDROXIDE 400 MG/5ML PO SUSP: 30.0000 mL | ORAL | Status: DC | PRN

## 2012-04-11 MED ORDER — SENNOSIDES-DOCUSATE SODIUM 8.6-50 MG PO TABS
2.0000 | ORAL_TABLET | Freq: Every day | ORAL | Status: DC
  Administered 2012-04-11 – 2012-04-12 (×2): 2 via ORAL

## 2012-04-11 MED ORDER — LANOLIN HYDROUS EX OINT: TOPICAL_OINTMENT | CUTANEOUS | Status: DC | PRN

## 2012-04-11 MED ORDER — WITCH HAZEL-GLYCERIN EX PADS: 1.0000 "application " | MEDICATED_PAD | CUTANEOUS | Status: DC | PRN

## 2012-04-11 MED ORDER — OXYCODONE-ACETAMINOPHEN 5-325 MG PO TABS
1.0000 | ORAL_TABLET | ORAL | Status: DC | PRN
Start: 2012-04-11 — End: 2012-04-13
  Administered 2012-04-11 (×2): 1 via ORAL
  Administered 2012-04-11 – 2012-04-13 (×7): 2 via ORAL
  Filled 2012-04-11: qty 2
  Filled 2012-04-11: qty 1
  Filled 2012-04-11: qty 2
  Filled 2012-04-11: qty 1
  Filled 2012-04-11 (×5): qty 2

## 2012-04-11 MED ORDER — TETANUS-DIPHTH-ACELL PERTUSSIS 5-2.5-18.5 LF-MCG/0.5 IM SUSP: 0.5000 mL | Freq: Once | INTRAMUSCULAR | Status: DC

## 2012-04-11 MED ORDER — ZOLPIDEM TARTRATE 5 MG PO TABS: 5.0000 mg | ORAL_TABLET | Freq: Every evening | ORAL | Status: DC | PRN

## 2012-04-11 MED ORDER — PRENATAL MULTIVITAMIN CH
1.0000 | ORAL_TABLET | Freq: Every day | ORAL | Status: DC
  Administered 2012-04-11 – 2012-04-13 (×3): 1 via ORAL
  Filled 2012-04-11 (×3): qty 1

## 2012-04-11 MED ORDER — SIMETHICONE 80 MG PO CHEW
80.0000 mg | CHEWABLE_TABLET | ORAL | Status: DC | PRN
  Administered 2012-04-13: 80 mg via ORAL

## 2012-04-11 MED ORDER — IBUPROFEN 600 MG PO TABS
600.0000 mg | ORAL_TABLET | Freq: Four times a day (QID) | ORAL | Status: DC
  Administered 2012-04-11 – 2012-04-13 (×9): 600 mg via ORAL
  Filled 2012-04-11 (×9): qty 1

## 2012-04-11 NOTE — Progress Notes (Signed)
Patient ID: Amanda [name], female   DOB: [birth year], 26 y.o.   MRN: [id number] .Subjective:  Pt c/o increased pain w ctx, feels better after position change and epidural pca   Objective: BP 107/69  Pulse 86  Temp 98.3 F (36.8 C) (Oral)  Resp 18  Ht 5\' 5"  (1.651 m)  Wt 178 lb (80.74 kg)  BMI 29.62 kg/m2  SpO2 97%   FHT:  FHR: 140 bpm, variability: moderate,  accelerations:  Present,  decelerations:  Absent UC:   regular, every 2 minutes SVE:  Deferred  MVU's <200, most recently 185   Assessment / Plan: induction for PD  Will defer VE at present until adequate MVU's  rv'd status w pt and family    Fetal Wellbeing:  Category I Pain Control:  Epidural  Update physician PRN  [name] 04/11/2012, 12:11 AM

## 2012-04-11 NOTE — Anesthesia Postprocedure Evaluation (Signed)
Anesthesia Post Note  Patient: Amanda [name]  Procedure(s) Performed: * No procedures listed *  Anesthesia type: General  Patient location: PACU  Post pain: Pain level controlled  Post assessment: Post-op Vital signs reviewed  Last Vitals:  Filed Vitals:   04/11/12 0948  BP: 133/69  Pulse: 94  Temp:   Resp: 16    Post vital signs: Reviewed  Level of consciousness: sedated  Complications: No apparent anesthesia complicationsfj

## 2012-04-11 NOTE — Progress Notes (Signed)
Patient ID: Amanda [name], female   DOB: 09/13/1985, 26 y.o.   MRN: [id number] .Subjective:  Coping well, feeling increased pressure   Objective: BP 138/83  Pulse 124  Temp 98.6 F (37 C) (Oral)  Resp 20  Ht 5\' 5"  (1.651 m)  Wt 178 lb (80.74 kg)  BMI 29.62 kg/m2  SpO2 97%   FHT:  FHR: 130 bpm, variability: moderate,  accelerations:  Present,  decelerations:  Present variables UC:   regular, every 2 minutes SVE:   Dilation: 10 Effacement (%): 100 Station: +1 Exam by:: S. Terrel Manalo, CNM  Increased caput, vtx descending   Assessment / Plan: pushing well  Continue    Fetal Wellbeing:  Category II Pain Control:  Epidural  Update physician PRN  Terricka Onofrio M 04/11/2012, 3:27 AM

## 2012-04-11 NOTE — Progress Notes (Signed)
Patient ID: Amanda [name], female   DOB: 12-09-1985, 26 y.o.   MRN: [id number] .Subjective: Has been resting, denies pressure or urge to push   Objective: BP 118/82  Pulse 82  Temp 98.3 F (36.8 C) (Oral)  Resp 18  Ht 5\' 5"  (1.651 m)  Wt 178 lb (80.74 kg)  BMI 29.62 kg/m2  SpO2 97%   FHT:  FHR: 130 bpm, variability: moderate,  accelerations:  Present,  decelerations:  Present variables UC:   regular, every 2 minutes SVE:   10/100/+2    Assessment / Plan: 2nd stage Labor down/pushing prn or approx 1hr    Fetal Wellbeing:  Category II Pain Control:  Epidural  Dr Amanda [name] updated   Amanda [name] 04/11/2012, 1:23 AM

## 2012-04-11 NOTE — Addendum Note (Signed)
Addendum  created 04/11/12 1749 by Dayrin Stallone M Jahon Bart, CRNA   Modules edited:Charges VN, Notes Section    

## 2012-04-11 NOTE — Addendum Note (Signed)
Addendum  created 04/11/12 1749 by Shanon Payor, CRNA   Modules edited:Charges VN, Notes Section

## 2012-04-11 NOTE — Anesthesia Postprocedure Evaluation (Signed)
  Anesthesia Post-op Note  Patient: Amanda [name]  Procedure(s) Performed: * No procedures listed *  Patient Location: [facility] Unit  Anesthesia Type:Epidural  Level of Consciousness: awake, alert  and oriented  Airway and Oxygen Therapy: Patient Spontanous Breathing  Post-op Pain: none  Post-op Assessment: Post-op Vital signs reviewed, Patient's Cardiovascular Status Stable, No headache, No backache, No residual numbness and No residual motor weakness  Post-op Vital Signs: Reviewed and stable  Complications: No apparent anesthesia complications

## 2012-04-12 LAB — CBC
HCT: 30.7 % — ABNORMAL LOW (ref 36.0–46.0)
Hemoglobin: 10.2 g/dL — ABNORMAL LOW (ref 12.0–15.0)
RBC: 3.38 MIL/uL — ABNORMAL LOW (ref 3.87–5.11)
RDW: 13.6 % (ref 11.5–15.5)
WBC: 13.2 10*3/uL — ABNORMAL HIGH (ref 4.0–10.5)

## 2012-04-12 NOTE — Progress Notes (Signed)
Post Partum Day 1 Subjective: up ad lib, voiding, tolerating PO, + flatus and working on pumping while newborn remains in NICU on antibiotics (no resp support).  Pt getting ready to get in shower.  Pain well controlled w/ po pain meds.  Husband remains at bedside and supportive. VB lightening. Pt reports Newborn will have at least 72 hr of antibiotics, so will not be ready for d/c tomorrow w/ pt.  Objective: Blood pressure 113/71, pulse 87, temperature 98 F (36.7 C), temperature source Oral, resp. rate 17, height 5\' 5"  (1.651 m), weight 178 lb (80.74 kg), SpO2 99.00%, unknown if currently breastfeeding.  Physical Exam:  General: alert, cooperative, no distress and smiling and pleasant Lochia: appropriate Uterine Fundus: firm Incision: n/a DVT Evaluation: No evidence of DVT seen on physical exam. Negative Homan's sign. No significant calf/ankle edema.   Basename 04/12/12 0523 04/09/12 1945  HGB 10.2* 12.8  HCT 30.7* 36.6    Assessment/Plan: Plan for discharge tomorrow and Lactation consult Pumping support while newborn in NICU.  Support given r/e newborn in NICU.     LOS: 3 days   Hargun Spurling H 04/12/2012, 11:15 AM

## 2012-04-13 MED ORDER — FERRALET 90 90-1 MG PO TABS: 1.0000 | ORAL_TABLET | Freq: Every day | ORAL | Status: DC

## 2012-04-13 MED ORDER — NORETHINDRONE 0.35 MG PO TABS: 1.0000 | ORAL_TABLET | Freq: Every day | ORAL | Status: DC

## 2012-04-13 MED ORDER — CALCIUM CARBONATE ANTACID 500 MG PO CHEW
800.0000 mg | CHEWABLE_TABLET | ORAL | Status: DC | PRN
  Administered 2012-04-13: 800 mg via ORAL
  Filled 2012-04-13: qty 1
  Filled 2012-04-13: qty 3

## 2012-04-13 MED ORDER — IBUPROFEN 600 MG PO TABS: 600.0000 mg | ORAL_TABLET | Freq: Four times a day (QID) | ORAL | Status: DC

## 2012-04-13 NOTE — Progress Notes (Signed)
Discharge instructions provided to patient and family at bedside.  No questions at this time.  Patient discharged home with personal belongings.   Left unit accompanied by staff in stable condition.  Osvaldo Angst, RN-----------------

## 2012-04-13 NOTE — Discharge Summary (Signed)
Obstetric Discharge Summary Reason for Admission: induction of labor for postdates Prenatal Procedures: NST and ultrasound Intrapartum Procedures: spontaneous vaginal delivery and epidural Postpartum Procedures: none Complications-Operative and Postpartum: 2n degree degree perineal laceration Hemoglobin  Date Value Range Status  04/12/2012 10.2* 12.0 - 15.0 g/dL Final  1/61/0960 45.4   Final     HCT  Date Value Range Status  04/12/2012 30.7* 36.0 - 46.0 % Final  08/17/2011 47   Final   Hospital course: Pt admitted at [redacted]w[redacted]d for induction secondary to postdates. She was given low-dose pitocin overnight and foley balloon placed in the AM, with pitocin continued. Epidural was placed, AROM, IUPC. NSVD by S.Lashunda Greis, CNM with 2nd degree laceration. NICU team was called secondary to poor respiratory effort by infant and infant was taken to NICU for further observation. Pt remained stable postpartum w mild anemia and ready for d/c on PPD2. Infant remained stable in NICU. Pt was pumping and infant had been at breast. Contraception plan was micronor.   Physical Exam:  General: alert and no distress Lochia: appropriate Uterine Fundus: firm Incision: healing well DVT Evaluation: No evidence of DVT seen on physical exam. Negative Homan's sign. No significant calf/ankle edema.  Discharge Diagnoses: Term Pregnancy-delivered  Discharge Information: Date: 04/13/2012 Activity: pelvic rest Diet: routine and iron-rich Medications: PNV, Ibuprofen, Iron and micronor Condition: stable Instructions: refer to practice specific booklet and routine printed instructions Discharge to: home Follow-up Information    Follow up with [facility]. In 6 weeks.   Contact information:   [address] [place] [address] [phone]         Newborn Data: Live born female  Birth Weight: 8 lb 7.9 oz (3853 g) APGAR: 1, 6  Infant remains stable  in NICU   Shyteria Lewis M 04/13/2012, 10:39 AM

## 2012-05-23 ENCOUNTER — Encounter: Payer: Self-pay | Admitting: Obstetrics and Gynecology

## 2012-05-23 ENCOUNTER — Ambulatory Visit: Payer: Federal, State, Local not specified - PPO | Admitting: Obstetrics and Gynecology

## 2012-05-23 ENCOUNTER — Ambulatory Visit (INDEPENDENT_AMBULATORY_CARE_PROVIDER_SITE_OTHER): Payer: Federal, State, Local not specified - PPO | Admitting: Obstetrics and Gynecology

## 2012-05-23 NOTE — Progress Notes (Signed)
Date of delivery: 04/11/2012 Female Name: Amanda [name]  Vaginal delivery:yes Rande Lawman CNM  Cesarean section:no Tubal ligation:no GDM:no Breast Feeding:yes Bottle Feeding:yes Post-Partum Blues:no Abnormal pap:yes Normal GU function: yes Normal GI function:yes Returning to work:yes EPDS:3    Subjective:     Amanda [name] is a 27 y.o. female who presents for a postpartum visit.  I have fully reviewed the prenatal and intrapartum course. See information above.    Patient is not sexually active.   The following portions of the patient's history were reviewed and updated as appropriate: allergies, current medications, past family history, past medical history, past social history, past surgical history and problem list.  Review of Systems Pertinent items are noted in HPI.   Objective:    BP 96/62  Ht 5\' 5"  (1.651 m)  Wt 150 lb (68.04 kg)  BMI 24.96 kg/m2  Breastfeeding? Yes  General:  alert, cooperative and no distress     Lungs:   Heart:    Abdomen: soft, non-tender; no masses,  no organomegaly   Vulva:  normal  Vagina: normal vagina  Cervix:  normal  Corpus: normal size, contour, position, consistency, mobility, non-tender  Adnexa:  normal adnexa        Cesarean section incision well healed: n/a       Assessment:     Normal postpartum exam.  Pap smear not done at today's visit.   Plan:     1. Contraception: oral progesterone-only contraceptive. 2. EPDS: 3. 3. Follow up in: 3 months or as needed. 4. Breast Feeding: Yes. 5. Return to work, exercising, and a normal activities: yes.     Janine Limbo MD 05/23/2012 7:25 PM

## 2012-08-05 ENCOUNTER — Ambulatory Visit: Payer: Federal, State, Local not specified - PPO

## 2012-08-05 VITALS — BP 112/62 | Resp 14 | Wt 149.0 lb

## 2012-08-05 DIAGNOSIS — Z124 Encounter for screening for malignant neoplasm of cervix: Secondary | ICD-10-CM

## 2012-08-05 NOTE — Progress Notes (Signed)
Subjective:    Amanda [name] is a 27 y.o. female, G2P1011, who presents for an annual exam.  Pt's brought infant "Amanda [name]" (4 mo old on 21st of this month) and husband, Amanda [name], to appt.  Pt returned to work 1st of January and Amanda [name] recently got promotion and working 2nd shift (2p-11p).  Infant stays w/ caretaker for just few hrs before Luvern gets off work.  BF'ng solely and pumps at least 2-3 times while at work.  Hasn't started Micronor yet, but has Rx; was initially concerned may affect milk supply if started too early.  Pt's only other c/o is low, mid back pain recently in area where epidural was for labor; she recently started more abdominal exercises and doesn't hurt when not leaning back on area.  Has been using heating pad prn.  No return of menses yet.  IC "a little different at first," but improving w/ time s/p delivery.  Unsure timing of next pregnancy.   History   Social History  . Marital Status: Married    Spouse Name: N/A    Number of Children: N/A  . Years of Education: N/A   Social History Main Topics  . Smoking status: Never Smoker   . Smokeless tobacco: Never Used  . Alcohol Use: No  . Drug Use: No  . Sexually Active: Yes    Birth Control/ Protection: Condom, None, Pill   Other Topics Concern  . None   Social History Narrative  . None    Menstrual cycle:   LMP: No LMP recorded. Patient is not currently having periods (Reason: Lactating).             The following portions of the patient's history were reviewed and updated as appropriate: allergies, current medications, past family history, past medical history, past social history, past surgical history and problem list.  Review of Systems Pertinent items are noted in HPI. Breast:Negative for breast lump,nipple discharge or nipple retraction Gastrointestinal: Negative for abdominal pain, change in bowel habits or rectal bleeding Urinary:negative   Objective:    BP 112/62  Resp 14  Wt 149 lb (67.586 kg)   BMI 24.79 kg/m2  Breastfeeding? Yes    Weight:  Wt Readings from Last 1 Encounters:  08/05/12 149 lb (67.586 kg)          BMI: Body mass index is 24.79 kg/(m^2).  General Appearance: Alert, appropriate appearance for age. No acute distress HEENT: Grossly normal Neck / Thyroid: Supple, no masses, nodes or enlargement Lungs: clear to auscultation bilaterally Back: No CVA tenderness Breast Exam: No dimpling, nipple retraction or discharge. No masses or nodes., bilateral nipples w/ yellow crustiness to tip; had pads on. Cardiovascular: Regular rate and rhythm. S1, S2, no murmur Gastrointestinal: Soft, non-tender, no masses or organomegaly Pelvic Exam: Vulva and vagina appear normal. Bimanual exam reveals normal uterus and adnexa. Cervix: normal appearance 3/5 kegel Rectovaginal: not indicated Lymphatic Exam: Non-palpable nodes in neck, clavicular, axillary, or inguinal regions Skin: no rash or abnormalities Neurologic: Normal gait and speech, no tremor  Psychiatric: Alert and oriented, appropriate affect.   Wet Prep:not applicable Urinalysis:not applicable UPT: Not done   Assessment:    Normal gyn exam   26y.o. MWF P1011, s/p SVD 4 mo ago; lactational amenorrhea; h/o abnl pap ~69yrs ago. Questionable h/o endometriosis in past. Plan:    Mammogram--n/a pap smear done, next pap due 2017 return annually or prn STD screening: declined Contraception:condoms, oral progesterone-only contraceptive--will start at her desire and condoms for 1st 2  weeks of pill -pack. Rec'd daily kegels, MVI, sunblock, healthy lifestyle.  Rexene Edison, CNM

## 2012-08-05 NOTE — Progress Notes (Signed)
The patient reports: no complaints Contraception:condoms Pt wants to discuss BC with HS; concerned BC will diminish her milk supply.   Last mammogram: never  Last pap: was normal August 07, 2011  GC/Chlamydia cultures offered: declined HIV/RPR/HbsAg offered:  declined HSV 1 and 2 glycoprotein offered: declined  Menstrual cycle regular and monthly: No. Bfeeding Menstrual flow normal: No  Urinary symptoms: none Normal bowel movements: Yes Reports abuse at home: No:

## 2012-11-11 ENCOUNTER — Encounter: Payer: Self-pay | Admitting: Neurology

## 2012-11-11 ENCOUNTER — Ambulatory Visit (INDEPENDENT_AMBULATORY_CARE_PROVIDER_SITE_OTHER): Payer: Federal, State, Local not specified - PPO | Admitting: Neurology

## 2012-11-11 VITALS — BP 92/69 | HR 77 | Ht 65.0 in | Wt 146.0 lb

## 2012-11-11 DIAGNOSIS — R413 Other amnesia: Secondary | ICD-10-CM

## 2012-11-11 NOTE — Patient Instructions (Signed)
Schedule MRI of the brain, we will call you with results.  No need to make follow up appointment.

## 2012-11-11 NOTE — Progress Notes (Signed)
[facility]  PATIENT: Amanda [name] DOB: [birth year]  REFERRING CLINICIAN: Elvera Lennox, PA HISTORY FROM: patient REASON FOR VISIT: consult   HISTORICAL  CHIEF COMPLAINT:  Chief Complaint  Patient presents with  . Memory Loss    # 4  New Patient    HISTORY OF PRESENT ILLNESS:  [name] is a 27 year old right-handed Caucasian female with past medical history of mitral valve prolapse. 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 She comes in today with c/o short term and long term memory loss.  She is married and a new mother with a 72 month old infant. 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 She states her job is very stressful but she does not feel very stressed by her infant.  She states that she has trouble remembering where she puts things, and forgets to keep appointments, and if she doesn't write things down she will easily forget.  She states that she cannot remember things from her hometown or activities done while younger that her husband and her friends can remember easily. She denies any head trauma in the past or any tramatic life experiences.  She is concerned about memory loss because she does not want to forget important moments from her daughter's childhood.  She denies vision changes, no lateralized motor and sensory deficit.  REVIEW OF SYSTEMS: Full 14 system review of systems performed and notable only for  Murmur, hearing loss, memory loss.  ALLERGIES: No Known Allergies  HOME MEDICATIONS: Outpatient Prescriptions Prior to Visit  Medication Sig Dispense Refill  . calcium carbonate (TUMS - DOSED IN MG ELEMENTAL CALCIUM) 500 MG chewable tablet Chew 2 tablets by mouth daily as needed. For heartburn      . Fe Cbn-Fe Gluc-FA-B12-C-DSS (FERRALET 90) 90-1 MG TABS Take 1 tablet by mouth daily.  60 each  1  . ibuprofen (ADVIL,MOTRIN) 600 MG tablet Take 1 tablet (600 mg total) by mouth every 6  (six) hours.  30 tablet  2  . norethindrone (ORTHO MICRONOR) 0.35 MG tablet Take 1 tablet (0.35 mg total) by mouth daily.  1 Package  11  . Prenatal Vit-Fe Fumarate-FA (PRENATAL MULTIVITAMIN) TABS Take 1 tablet by mouth daily.       No facility-administered medications prior to visit.    PAST MEDICAL HISTORY: Past Medical History  Diagnosis Date  . Miscarriage   . Abnormal Pap smear   . Endometriosis   . MVP (mitral valve prolapse)   . Heart murmur     PAST SURGICAL HISTORY: Past Surgical History  Procedure Laterality Date  . Cervical biopsy  w/ loop electrode excision  2004  . Wisdom tooth extraction      FAMILY HISTORY: Family History  Problem Relation Age of Onset  . Hypertension Mother   . Obesity Mother   . Asthma Sister   . Obesity Sister   . Cancer Maternal Uncle   . Cancer Maternal Grandmother     breast  . Hyperlipidemia Maternal Grandmother   . Hypertension Maternal Grandfather   . Hyperlipidemia Maternal Grandfather   . Diabetes Paternal Grandfather   . Hypertension Paternal Grandfather   . Heart disease Paternal Grandfather   . Heart disease Father     SOCIAL HISTORY: History   Social History  . Marital Status: Married    Spouse Name: [name]    Number of Children: 1  . Years of Education: college  Occupational History  .      [employer]   Social History Main Topics  . Smoking status: Never Smoker   . Smokeless tobacco: Never Used  . Alcohol Use: No  . Drug Use: No  . Sexually Active: Yes    Birth Control/ Protection: Condom, None, Pill   Other Topics Concern  . Not on file   Social History Narrative   Patient lives at home with her husband ([name]). Patient works [employer]. Patient has her associates degree. Patient has one child.   Right handed.   Caffeine None      PHYSICAL EXAM  Filed Vitals:   11/11/12 0850  BP: 92/69  Pulse: 77  Height: 5\' 5"  (1.651 m)  Weight: 146 lb (66.225 kg)   Body mass index is 24.3  kg/(m^2).  GENERAL EXAM: Patient is in no distress, well developed, well groomed.  CARDIOVASCULAR: Regular rate and rhythm, no murmurs, no carotid bruits  NEUROLOGIC: MENTAL STATUS: awake, alert, language fluent, comprehension intact, naming intact. MMSE 30/30. Animal Fluency Score 19 (Normal 12+). CRANIAL NERVE: no papilledema on fundoscopic exam, pupils equal and reactive to light, visual fields full to confrontation, extraocular muscles intact, no nystagmus, facial sensation and strength symmetric, uvula midline, shoulder shrug symmetric, tongue midline. MOTOR: normal bulk and tone, full strength in the BUE, BLE SENSORY: normal and symmetric to light touch, pinprick, temperature, vibration and proprioception COORDINATION: finger-nose-finger, fine finger movements normal REFLEXES: deep tendon reflexes present and symmetric GAIT/STATION: narrow based gait; able to walk on toes, heels and tandem; romberg is negative  Vitamin B12  530   10/23/12 TSH   1.03 10/23/12 Cortisol-PM 9.1 10/23/12  ASSESSMENT AND PLAN  27 y.o. year old female  has a past medical history of Endometriosis; MVP (mitral valve prolapse); and Heart murmur. here with subjective memory loss.  Ddx: Stress/Depression, Sleep Deprivation, need to rule out cns structure lesion, metabolic, nutritional deficiency Medication side-effect  PLAN: 1. MRI brain without contrast to evaluate for organic cause of memory loss.  2. Encouraged healthy lifestyle; home/work balance, adequate sleep.   Manisha Cancel NP-C 11/11/2012, 9:33 AM  [facility] [address], [address] [place], [place] [address] [phone]

## 2012-11-14 ENCOUNTER — Ambulatory Visit (INDEPENDENT_AMBULATORY_CARE_PROVIDER_SITE_OTHER): Payer: Federal, State, Local not specified - PPO

## 2012-11-14 DIAGNOSIS — R413 Other amnesia: Secondary | ICD-10-CM

## 2012-11-15 NOTE — Progress Notes (Signed)
Quick Note:  I have called her, MRI showed  1. Minimal periventricular gliosis / capping of the bifrontal horns of lateral ventricles. There is a single right frontal juxtacortical focus of non-specific gliosis (series 4 image 14). These findings are non-specific and considerations include autoimmune, inflammatory, post-infectious, microvascular ischemic, migraine associated etiologies or normal variant. 2. No atrophy.  3. No acute findings.  ______

## 2013-02-19 LAB — OB RESULTS CONSOLE ANTIBODY SCREEN: Antibody Screen: NEGATIVE

## 2013-02-19 LAB — OB RESULTS CONSOLE HEPATITIS B SURFACE ANTIGEN: HEP B S AG: NEGATIVE

## 2013-02-19 LAB — OB RESULTS CONSOLE RUBELLA ANTIBODY, IGM: Rubella: IMMUNE

## 2013-02-19 LAB — OB RESULTS CONSOLE RPR: RPR: NONREACTIVE

## 2013-02-19 LAB — OB RESULTS CONSOLE GC/CHLAMYDIA
Chlamydia: NEGATIVE
Gonorrhea: NEGATIVE

## 2013-02-19 LAB — OB RESULTS CONSOLE HIV ANTIBODY (ROUTINE TESTING): HIV: NONREACTIVE

## 2013-02-19 LAB — OB RESULTS CONSOLE ABO/RH: RH TYPE: POSITIVE

## 2013-03-27 ENCOUNTER — Other Ambulatory Visit: Payer: Self-pay

## 2013-05-22 NOTE — L&D Delivery Note (Signed)
Delivery Note At 11:21 AM a healthy female "Amanda [name]" was delivered via Vaginal, Spontaneous Delivery (Presentation: Left Occiput Anterior).  APGAR: 7, 9; weight .  NICU team was present as particulate meconium was noted after AROM.  Placenta delivered spontaneously, intact,  and appeared greenish in color.  Will not be sent to lab. 3 vessel cord with the following complications: None.  Cord pH: N/A  Anesthesia: Epidural  Episiotomy: None Lacerations: 1st degree;Perineal Suture Repair: 3.0 vicryl Est. Blood Loss (mL): 250  Mom to postpartum.  Baby to Couplet care / Skin to Skin. Mom unsure of birth control method  Barbaraann Boys, MSN 10/24/2013, 12:09 PM

## 2013-06-03 ENCOUNTER — Encounter: Payer: Self-pay | Admitting: Internal Medicine

## 2013-06-03 ENCOUNTER — Ambulatory Visit (INDEPENDENT_AMBULATORY_CARE_PROVIDER_SITE_OTHER): Payer: Federal, State, Local not specified - PPO | Admitting: Internal Medicine

## 2013-06-03 VITALS — BP 118/72 | HR 84 | Ht 65.0 in | Wt 154.0 lb

## 2013-06-03 DIAGNOSIS — I059 Rheumatic mitral valve disease, unspecified: Secondary | ICD-10-CM

## 2013-06-03 DIAGNOSIS — R5383 Other fatigue: Secondary | ICD-10-CM

## 2013-06-03 DIAGNOSIS — R002 Palpitations: Secondary | ICD-10-CM | POA: Insufficient documentation

## 2013-06-03 DIAGNOSIS — R011 Cardiac murmur, unspecified: Secondary | ICD-10-CM

## 2013-06-03 DIAGNOSIS — R5381 Other malaise: Secondary | ICD-10-CM

## 2013-06-03 DIAGNOSIS — I341 Nonrheumatic mitral (valve) prolapse: Secondary | ICD-10-CM

## 2013-06-03 NOTE — Progress Notes (Signed)
OFFICE NOTE  Chief Complaint:  Palpitations, fatigue, MVP  Primary Care Physician: Elvera Lennox, PA-C  HPI:  Amanda [name] is a pleasant 28 year old female who has a history of mitral valve prolapse. She actually had a murmur that was diagnosed at age 36 and was really asymptomatic until high school when she developed palpitations. She underwent an echocardiogram there in [place], which demonstrated mitral valve prolapse. She was recommended to have antibiotic prophylaxis however has discontinued that. She recently had a pregnancy and has a daughter who is healthy and her pregnancy went fairly uneventfully. She is currently now [redacted] weeks pregnant and is having significant palpitations, fatigue and lack of energy as well as some shortness of breath doing normal activities.  She said that she wouldn't be particular bothered by it, but she is concerned about the baby and wished to have her heart evaluated for that reason.  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 She said that she had a significant workup for endocrine abnormalities and was found to have very high "stress hormone" levels after coming back from the war, which is not surprising.  She reports being treated with natural products and those levels have improved. She denies any chest pain.  PMHx:  Past Medical History  Diagnosis Date  . Miscarriage   . Abnormal Pap smear   . Endometriosis   . MVP (mitral valve prolapse)   . Heart murmur     Past Surgical History  Procedure Laterality Date  . Cervical biopsy  w/ loop electrode excision  2004  . Wisdom tooth extraction      FAMHx:  Family History  Problem Relation Age of Onset  . Hypertension Mother     hyperlipidemia  . Obesity Mother   . Asthma Sister   . Obesity Sister   . Cancer Maternal Uncle    . Breast cancer Maternal Grandmother   . Hyperlipidemia Maternal Grandmother   . Hypertension Maternal Grandfather   . Hyperlipidemia Maternal Grandfather   . Diabetes Paternal Grandfather   . Hypertension Paternal Grandfather   . Heart disease Paternal Grandfather     CABGx3  . Heart disease Father     SOCHx:   reports that she has never smoked. She has never used smokeless tobacco. She reports that she does not drink alcohol or use illicit drugs.  ALLERGIES:  No Known Allergies  ROS: A comprehensive review of systems was negative except for: Constitutional: positive for fatigue Respiratory: positive for dyspnea on exertion Cardiovascular: positive for palpitations  HOME MEDS: Current Outpatient Prescriptions  Medication Sig Dispense Refill  . Prenatal Vit-Fe Fumarate-FA (PRENATAL MULTIVITAMIN) TABS Take 1 tablet by mouth daily.       No current facility-administered medications for this visit.    LABS/IMAGING: No results found for this or any previous visit (from the past 48 hour(s)). No results found.  VITALS: BP 118/72  Pulse 84  Ht 5\' 5"  (1.651 m)  Wt 154 lb (69.854 kg)  BMI 25.63 kg/m2  EXAM: General appearance: alert and no distress Neck: no carotid bruit and no JVD Lungs: clear to auscultation bilaterally Heart: regular rate and rhythm, S1, S2 normal, faint 2/6 SEM at apex Abdomen: soft, non-tender; bowel sounds normal; no masses,  no organomegaly Extremities: extremities normal, atraumatic, no cyanosis or edema Pulses: 2+ and  symmetric Skin: Skin color, texture, turgor normal. No rashes or lesions Neurologic: Grossly normal Psych: Mood, affect normal  EKG: Normal sinus rhythm at 84  ASSESSMENT: 1. Fatigue, palpitations, dyspnea 2. History of mitral valve prolapse 3. [redacted] weeks pregnant  PLAN: 1.   Amanda [name] is reporting worsening fatigue, palpitations and a feeling of shortness of breath on nearly a daily basis throughout this pregnancy. This  was not the case during her first pregnancy.  She denies any heart failure symptoms or lower extremity swelling, however there is a soft systolic murmur without a midsystolic click. I would like to recheck an echocardiogram if she's not had one since she was 28 years old.  This is also to rule out any cardiomyopathy. I will go ahead and place her on a 48 hour Holter monitor and plan to see her back in a few weeks to discuss the results of these tests.  If at all possible, would like to avoid medical therapy during the pregnancy, as beta blockers could lead to intrauterine growth restriction.  Thank you again for the kind referral.  Chrystie NoseKenneth C. Linley Moxley, MD, [facility] WilmingtonFACC Attending Cardiologist [facility]  [facility] 06/03/2013, 10:45 AM

## 2013-06-03 NOTE — Patient Instructions (Signed)
Your physician has requested that you have an echocardiogram. Echocardiography is a painless test that uses sound waves to create images of your heart. It provides your doctor with information about the size and shape of your heart and how well your heart's chambers and valves are working. This procedure takes approximately one hour. There are no restrictions for this procedure.  Your physician has recommended that you wear a holter monitor. Holter monitors are medical devices that record the heart's electrical activity. Doctors most often use these monitors to diagnose arrhythmias. Arrhythmias are problems with the speed or rhythm of the heartbeat. The monitor is a small, portable device. You can wear one while you do your normal daily activities. This is usually used to diagnose what is causing palpitations/syncope (passing out). Dr. Rennis GoldenHilty wants you to wear this for 48 hours.  Your physician recommends that you schedule a follow-up appointment in a few weeks - after your echo.

## 2013-06-12 ENCOUNTER — Ambulatory Visit (HOSPITAL_COMMUNITY)
Admission: RE | Admit: 2013-06-12 | Discharge: 2013-06-12 | Disposition: A | Discharge status: Home / Self Care | Payer: Federal, State, Local not specified - PPO | Source: Ambulatory Visit | Attending: Internal Medicine | Admitting: Internal Medicine

## 2013-06-12 DIAGNOSIS — I341 Nonrheumatic mitral (valve) prolapse: Secondary | ICD-10-CM

## 2013-06-12 DIAGNOSIS — R002 Palpitations: Secondary | ICD-10-CM | POA: Insufficient documentation

## 2013-06-12 DIAGNOSIS — R011 Cardiac murmur, unspecified: Secondary | ICD-10-CM | POA: Insufficient documentation

## 2013-06-12 DIAGNOSIS — R42 Dizziness and giddiness: Secondary | ICD-10-CM | POA: Insufficient documentation

## 2013-06-12 DIAGNOSIS — I059 Rheumatic mitral valve disease, unspecified: Secondary | ICD-10-CM | POA: Insufficient documentation

## 2013-06-12 NOTE — Progress Notes (Signed)
2D Echo Performed 06/12/2013    Nichoel Digiulio, RCS  

## 2013-06-13 DIAGNOSIS — R002 Palpitations: Secondary | ICD-10-CM

## 2013-06-24 ENCOUNTER — Encounter: Payer: Self-pay | Admitting: Internal Medicine

## 2013-06-24 ENCOUNTER — Ambulatory Visit (INDEPENDENT_AMBULATORY_CARE_PROVIDER_SITE_OTHER): Payer: Federal, State, Local not specified - PPO | Admitting: Internal Medicine

## 2013-06-24 VITALS — BP 102/72 | HR 72 | Ht 65.0 in | Wt 159.9 lb

## 2013-06-24 DIAGNOSIS — I341 Nonrheumatic mitral (valve) prolapse: Secondary | ICD-10-CM

## 2013-06-24 DIAGNOSIS — I059 Rheumatic mitral valve disease, unspecified: Secondary | ICD-10-CM

## 2013-06-24 DIAGNOSIS — R002 Palpitations: Secondary | ICD-10-CM

## 2013-06-24 NOTE — Progress Notes (Signed)
OFFICE NOTE  Chief Complaint:  Palpitations, fatigue, MVP  Primary Care Physician: Amanda Lennox, PA-C  HPI:  Amanda [name] is a pleasant 28 year old female who has a history of mitral valve prolapse. She actually had a murmur that was diagnosed at age 12 and was really asymptomatic until high school when she developed palpitations. She underwent an echocardiogram there in [place], which demonstrated mitral valve prolapse. She was recommended to have antibiotic prophylaxis however has discontinued that. She recently had a pregnancy and has a daughter who is healthy and her pregnancy went fairly uneventfully. She is currently now [redacted] weeks pregnant and is having significant palpitations, fatigue and lack of energy as well as some shortness of breath doing normal activities.  She said that she wouldn't be particular bothered by it, but she is concerned about the baby and wished to have her heart evaluated for that reason.  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 She said that she had a significant workup for endocrine abnormalities and was found to have very high "stress hormone" levels after coming back from the war, which is not surprising.  She reports being treated with natural products and those levels have improved. She denies any chest pain.  Amanda [name] returns today for followup of her studies. She reports that she still having some palpitations and is troubled by fatigue and some occasional shortness of breath. Her echocardiogram was essentially normal with EF of 60-65%. There were no valvular abnormalities. High cardiac output was noted, consistent with pregnancy. She did wear a Holter monitor for 48 hours. Out of [phone] heart beats, there were 46 isolated ventricular ectopy. There was one pair of PVCs but no  nonsustained VT, or other troublesome rhythms.  PMHx:  Past Medical History  Diagnosis Date  . Miscarriage   . Abnormal Pap smear   . Endometriosis   . MVP (mitral valve prolapse)   . Heart murmur     Past Surgical History  Procedure Laterality Date  . Cervical biopsy  w/ loop electrode excision  2004  . Wisdom tooth extraction      FAMHx:  Family History  Problem Relation Age of Onset  . Hypertension Mother     hyperlipidemia  . Obesity Mother   . Asthma Sister   . Obesity Sister   . Cancer Maternal Uncle   . Breast cancer Maternal Grandmother   . Hyperlipidemia Maternal Grandmother   . Hypertension Maternal Grandfather   . Hyperlipidemia Maternal Grandfather   . Diabetes Paternal Grandfather   . Hypertension Paternal Grandfather   . Heart disease Paternal Grandfather     CABGx3  . Heart disease Father     SOCHx:   reports that she has never smoked. She has never used smokeless tobacco. She reports that she does not drink alcohol or use illicit drugs.  ALLERGIES:  No Known Allergies  ROS: A comprehensive review of systems was negative except for: Constitutional: positive for fatigue Respiratory: positive for dyspnea on exertion Cardiovascular: positive for palpitations  HOME MEDS: Current Outpatient Prescriptions  Medication Sig Dispense Refill  . Prenatal Vit-Fe Fumarate-FA (PRENATAL MULTIVITAMIN) TABS Take 1 tablet by mouth daily.       No current facility-administered medications for this visit.    LABS/IMAGING: No results found for this or any previous visit (  from the past 48 hour(s)). No results found.  VITALS: BP 102/72  Pulse 72  Ht 5\' 5"  (1.651 m)  Wt 159 lb 14.4 oz (72.53 kg)  BMI 26.61 kg/m2  EXAM: deferred  EKG: deferred  ASSESSMENT: 1. Fatigue, palpitations, dyspnea 2. History of mitral valve prolapse - none noted on echocardiogram 3. [redacted] weeks pregnant  PLAN: 1.   Amanda [name] had a reassuring echocardiogram with a  preserved EF and no valvular abnormalities. Her monitor demonstrated isolated PVCs, but no worrisome findings. She may be quite sensitive to PVCs, but I suspect some of her feelings of palpitations are unrelated to these isolated beats. Nonetheless, there is nothing concerning about her workup from a cardiovascular standpoint. At suspect that her fatigue and shortness of breath are related to the pregnancy and will likely resolve within the month after delivery. I'm happy to see her back as needed.  Thank you again for the kind referral.  Chrystie NoseKenneth C. Hilty, MD, [facility] Healthcare AssociationFACC Attending Cardiologist [facility]  HILTY,Kenneth C 06/24/2013, 12:57 PM

## 2013-06-24 NOTE — Patient Instructions (Signed)
Return as needed

## 2013-07-25 ENCOUNTER — Other Ambulatory Visit: Payer: Self-pay | Admitting: *Deleted

## 2013-07-25 DIAGNOSIS — R002 Palpitations: Secondary | ICD-10-CM

## 2013-09-24 LAB — OB RESULTS CONSOLE GBS: GBS: POSITIVE

## 2013-10-24 ENCOUNTER — Inpatient Hospital Stay (HOSPITAL_COMMUNITY)
Admission: AD | Admit: 2013-10-24 | Discharge: 2013-10-26 | DRG: 774 | Disposition: A | Discharge status: Home / Self Care | Payer: Federal, State, Local not specified - PPO | Source: Ambulatory Visit | Attending: Obstetrics and Gynecology | Admitting: Obstetrics and Gynecology

## 2013-10-24 ENCOUNTER — Encounter (HOSPITAL_COMMUNITY): Payer: Federal, State, Local not specified - PPO | Admitting: Anesthesiology

## 2013-10-24 ENCOUNTER — Inpatient Hospital Stay (HOSPITAL_COMMUNITY): Payer: Federal, State, Local not specified - PPO | Admitting: Anesthesiology

## 2013-10-24 ENCOUNTER — Encounter (HOSPITAL_COMMUNITY): Payer: Self-pay | Admitting: *Deleted

## 2013-10-24 DIAGNOSIS — O9989 Other specified diseases and conditions complicating pregnancy, childbirth and the puerperium: Secondary | ICD-10-CM

## 2013-10-24 DIAGNOSIS — O9942 Diseases of the circulatory system complicating childbirth: Secondary | ICD-10-CM

## 2013-10-24 DIAGNOSIS — O9903 Anemia complicating the puerperium: Secondary | ICD-10-CM | POA: Diagnosis present

## 2013-10-24 DIAGNOSIS — O99820 Streptococcus B carrier state complicating pregnancy: Secondary | ICD-10-CM

## 2013-10-24 DIAGNOSIS — Z22330 Carrier of Group B streptococcus: Secondary | ICD-10-CM

## 2013-10-24 DIAGNOSIS — I059 Rheumatic mitral valve disease, unspecified: Secondary | ICD-10-CM | POA: Diagnosis present

## 2013-10-24 DIAGNOSIS — O99892 Other specified diseases and conditions complicating childbirth: Secondary | ICD-10-CM | POA: Diagnosis present

## 2013-10-24 DIAGNOSIS — Z833 Family history of diabetes mellitus: Secondary | ICD-10-CM

## 2013-10-24 DIAGNOSIS — I2510 Atherosclerotic heart disease of native coronary artery without angina pectoris: Secondary | ICD-10-CM | POA: Diagnosis present

## 2013-10-24 DIAGNOSIS — D649 Anemia, unspecified: Secondary | ICD-10-CM | POA: Diagnosis present

## 2013-10-24 LAB — CBC
HEMATOCRIT: 36.4 % (ref 36.0–46.0)
HEMOGLOBIN: 12.6 g/dL (ref 12.0–15.0)
MCH: 30.5 pg (ref 26.0–34.0)
MCHC: 34.6 g/dL (ref 30.0–36.0)
MCV: 88.1 fL (ref 78.0–100.0)
Platelets: 275 10*3/uL (ref 150–400)
RBC: 4.13 MIL/uL (ref 3.87–5.11)
RDW: 13.8 % (ref 11.5–15.5)
WBC: 11.9 10*3/uL — ABNORMAL HIGH (ref 4.0–10.5)

## 2013-10-24 LAB — RPR

## 2013-10-24 LAB — OB RESULTS CONSOLE GBS: STREP GROUP B AG: POSITIVE

## 2013-10-24 MED ORDER — OXYTOCIN 40 UNITS IN LACTATED RINGERS INFUSION - SIMPLE MED
62.5000 mL/h | INTRAVENOUS | Status: DC
  Administered 2013-10-24: 62.5 mL/h via INTRAVENOUS
  Filled 2013-10-24: qty 1000

## 2013-10-24 MED ORDER — EPHEDRINE 5 MG/ML INJ
10.0000 mg | INTRAVENOUS | Status: DC | PRN
  Filled 2013-10-24: qty 2
  Filled 2013-10-24: qty 4

## 2013-10-24 MED ORDER — PRENATAL MULTIVITAMIN CH
1.0000 | ORAL_TABLET | Freq: Every day | ORAL | Status: DC
  Administered 2013-10-24 – 2013-10-25 (×2): 1 via ORAL
  Filled 2013-10-24 (×2): qty 1

## 2013-10-24 MED ORDER — SENNOSIDES-DOCUSATE SODIUM 8.6-50 MG PO TABS
2.0000 | ORAL_TABLET | ORAL | Status: DC
  Administered 2013-10-24 – 2013-10-26 (×2): 2 via ORAL
  Filled 2013-10-24 (×2): qty 2

## 2013-10-24 MED ORDER — SIMETHICONE 80 MG PO CHEW: 80.0000 mg | CHEWABLE_TABLET | ORAL | Status: DC | PRN

## 2013-10-24 MED ORDER — OXYCODONE-ACETAMINOPHEN 5-325 MG PO TABS
1.0000 | ORAL_TABLET | ORAL | Status: DC | PRN
  Administered 2013-10-24 – 2013-10-26 (×3): 1 via ORAL
  Filled 2013-10-24 (×3): qty 1

## 2013-10-24 MED ORDER — ONDANSETRON HCL 4 MG PO TABS: 4.0000 mg | ORAL_TABLET | ORAL | Status: DC | PRN

## 2013-10-24 MED ORDER — LANOLIN HYDROUS EX OINT: TOPICAL_OINTMENT | CUTANEOUS | Status: DC | PRN

## 2013-10-24 MED ORDER — LACTATED RINGERS IV SOLN
500.0000 mL | INTRAVENOUS | Status: DC | PRN
  Administered 2013-10-24: 500 mL via INTRAVENOUS

## 2013-10-24 MED ORDER — BENZOCAINE-MENTHOL 20-0.5 % EX AERO: 1.0000 "application " | INHALATION_SPRAY | CUTANEOUS | Status: DC | PRN

## 2013-10-24 MED ORDER — DEXTROSE 5 % IV SOLN
2.5000 10*6.[IU] | INTRAVENOUS | Status: DC
  Filled 2013-10-24 (×3): qty 2.5

## 2013-10-24 MED ORDER — PENICILLIN G POTASSIUM 5000000 UNITS IJ SOLR
2.5000 10*6.[IU] | INTRAVENOUS | Status: DC
  Administered 2013-10-24: 2.5 10*6.[IU] via INTRAVENOUS
  Filled 2013-10-24 (×6): qty 2.5

## 2013-10-24 MED ORDER — ONDANSETRON HCL 4 MG/2ML IJ SOLN: 4.0000 mg | INTRAMUSCULAR | Status: DC | PRN

## 2013-10-24 MED ORDER — EPHEDRINE 5 MG/ML INJ
10.0000 mg | INTRAVENOUS | Status: DC | PRN
  Filled 2013-10-24: qty 2

## 2013-10-24 MED ORDER — ZOLPIDEM TARTRATE 5 MG PO TABS: 5.0000 mg | ORAL_TABLET | Freq: Every evening | ORAL | Status: DC | PRN

## 2013-10-24 MED ORDER — PHENYLEPHRINE 40 MCG/ML (10ML) SYRINGE FOR IV PUSH (FOR BLOOD PRESSURE SUPPORT)
80.0000 ug | PREFILLED_SYRINGE | INTRAVENOUS | Status: DC | PRN
  Filled 2013-10-24: qty 2

## 2013-10-24 MED ORDER — LACTATED RINGERS IV SOLN: 500.0000 mL | Freq: Once | INTRAVENOUS | Status: DC

## 2013-10-24 MED ORDER — PENICILLIN G POTASSIUM 5000000 UNITS IJ SOLR
2.5000 10*6.[IU] | INTRAVENOUS | Status: DC
  Filled 2013-10-24 (×3): qty 2.5

## 2013-10-24 MED ORDER — DIPHENHYDRAMINE HCL 25 MG PO CAPS: 25.0000 mg | ORAL_CAPSULE | Freq: Four times a day (QID) | ORAL | Status: DC | PRN

## 2013-10-24 MED ORDER — PENICILLIN G POTASSIUM 5000000 UNITS IJ SOLR
5.0000 10*6.[IU] | Freq: Once | INTRAVENOUS | Status: AC
  Administered 2013-10-24: 5 10*6.[IU] via INTRAVENOUS
  Filled 2013-10-24: qty 5

## 2013-10-24 MED ORDER — FLEET ENEMA 7-19 GM/118ML RE ENEM: 1.0000 | ENEMA | RECTAL | Status: DC | PRN

## 2013-10-24 MED ORDER — LACTATED RINGERS IV SOLN
INTRAVENOUS | Status: DC
  Administered 2013-10-24 (×2): via INTRAVENOUS

## 2013-10-24 MED ORDER — LIDOCAINE HCL (PF) 1 % IJ SOLN
INTRAMUSCULAR | Status: DC | PRN
  Administered 2013-10-24 (×4): 4 mL

## 2013-10-24 MED ORDER — ACETAMINOPHEN 325 MG PO TABS: 650.0000 mg | ORAL_TABLET | ORAL | Status: DC | PRN

## 2013-10-24 MED ORDER — OXYTOCIN BOLUS FROM INFUSION: 500.0000 mL | INTRAVENOUS | Status: DC

## 2013-10-24 MED ORDER — PHENYLEPHRINE 40 MCG/ML (10ML) SYRINGE FOR IV PUSH (FOR BLOOD PRESSURE SUPPORT)
80.0000 ug | PREFILLED_SYRINGE | INTRAVENOUS | Status: DC | PRN
  Filled 2013-10-24: qty 10
  Filled 2013-10-24: qty 2

## 2013-10-24 MED ORDER — LIDOCAINE HCL (PF) 1 % IJ SOLN
30.0000 mL | INTRAMUSCULAR | Status: DC | PRN
  Filled 2013-10-24: qty 30

## 2013-10-24 MED ORDER — TETANUS-DIPHTH-ACELL PERTUSSIS 5-2.5-18.5 LF-MCG/0.5 IM SUSP: 0.5000 mL | Freq: Once | INTRAMUSCULAR | Status: DC

## 2013-10-24 MED ORDER — IBUPROFEN 600 MG PO TABS
600.0000 mg | ORAL_TABLET | Freq: Four times a day (QID) | ORAL | Status: DC | PRN
  Administered 2013-10-25: 600 mg via ORAL

## 2013-10-24 MED ORDER — PENICILLIN G POTASSIUM 5000000 UNITS IJ SOLR
5.0000 10*6.[IU] | Freq: Once | INTRAMUSCULAR | Status: DC
  Filled 2013-10-24: qty 5

## 2013-10-24 MED ORDER — IBUPROFEN 600 MG PO TABS
600.0000 mg | ORAL_TABLET | Freq: Four times a day (QID) | ORAL | Status: DC
  Administered 2013-10-24 – 2013-10-26 (×8): 600 mg via ORAL
  Filled 2013-10-24 (×8): qty 1

## 2013-10-24 MED ORDER — PENICILLIN G POTASSIUM 5000000 UNITS IJ SOLR: 2.5000 10*6.[IU] | INTRAVENOUS | Status: DC

## 2013-10-24 MED ORDER — BUTORPHANOL TARTRATE 1 MG/ML IJ SOLN: 1.0000 mg | INTRAMUSCULAR | Status: DC | PRN

## 2013-10-24 MED ORDER — FENTANYL 2.5 MCG/ML BUPIVACAINE 1/10 % EPIDURAL INFUSION (WH - ANES)
14.0000 mL/h | INTRAMUSCULAR | Status: DC | PRN
  Administered 2013-10-24: 14 mL/h via EPIDURAL
  Filled 2013-10-24: qty 125

## 2013-10-24 MED ORDER — DIPHENHYDRAMINE HCL 50 MG/ML IJ SOLN: 12.5000 mg | INTRAMUSCULAR | Status: DC | PRN

## 2013-10-24 MED ORDER — ONDANSETRON HCL 4 MG/2ML IJ SOLN: 4.0000 mg | Freq: Four times a day (QID) | INTRAMUSCULAR | Status: DC | PRN

## 2013-10-24 MED ORDER — CITRIC ACID-SODIUM CITRATE 334-500 MG/5ML PO SOLN: 30.0000 mL | ORAL | Status: DC | PRN

## 2013-10-24 MED ORDER — DIBUCAINE 1 % RE OINT: 1.0000 "application " | TOPICAL_OINTMENT | RECTAL | Status: DC | PRN

## 2013-10-24 MED ORDER — OXYCODONE-ACETAMINOPHEN 5-325 MG PO TABS: 1.0000 | ORAL_TABLET | ORAL | Status: DC | PRN

## 2013-10-24 MED ORDER — WITCH HAZEL-GLYCERIN EX PADS: 1.0000 "application " | MEDICATED_PAD | CUTANEOUS | Status: DC | PRN

## 2013-10-24 MED ORDER — PENICILLIN G POTASSIUM 5000000 UNITS IJ SOLR: 5.0000 10*6.[IU] | Freq: Once | INTRAMUSCULAR | Status: DC

## 2013-10-24 NOTE — Anesthesia Preprocedure Evaluation (Signed)
Anesthesia Evaluation  Patient identified by MRN, date of birth, ID band Patient awake    Reviewed: Allergy & Precautions, H&P , NPO status , Patient's Chart, lab work & pertinent test results, reviewed documented beta blocker date and time   History of Anesthesia Complications Negative for: history of anesthetic complications  Airway Mallampati: II TM Distance: >3 FB Neck ROM: full    Dental  (+) Teeth Intact   Pulmonary neg pulmonary ROS,  breath sounds clear to auscultation        Cardiovascular + Valvular Problems/Murmurs MVP Rhythm:regular Rate:Normal     Neuro/Psych negative neurological ROS  negative psych ROS   GI/Hepatic negative GI ROS, Neg liver ROS,   Endo/Other  negative endocrine ROS  Renal/GU negative Renal ROS  negative genitourinary   Musculoskeletal   Abdominal   Peds  Hematology negative hematology ROS (+)   Anesthesia Other Findings   Reproductive/Obstetrics (+) Pregnancy                           Anesthesia Physical Anesthesia Plan  ASA: II  Anesthesia Plan: Epidural   Post-op Pain Management:    Induction:   Airway Management Planned:   Additional Equipment:   Intra-op Plan:   Post-operative Plan:   Informed Consent: I have reviewed the patients History and Physical, chart, labs and discussed the procedure including the risks, benefits and alternatives for the proposed anesthesia with the patient or authorized representative who has indicated his/her understanding and acceptance.     Plan Discussed with:   Anesthesia Plan Comments:         Anesthesia Quick Evaluation

## 2013-10-24 NOTE — Progress Notes (Signed)
  Subjective: Comfortable with epidural.  Objective: BP 122/80  Pulse 97  Temp(Src) 98.1 F (36.7 C) (Oral)  Resp 18  Ht 5\' 5"  (1.651 m)  Wt 179 lb (81.194 kg)  BMI 29.79 kg/m2  SpO2 99%      FHT: Category 1 UC:   regular, every 2 minutes SVE:   Dilation: 7 Effacement (%): 100 Station: -1 Exam by:: V Marilu Rylander  BBOW--moderate MSF noted, non-particulate.  Assessment:  Progressive labor MSF GBS positive  Plan: Continue to observe. Await urge to push.  Nigel Bridgeman CNM 10/24/2013, 6:47 AM

## 2013-10-24 NOTE — MAU Note (Signed)
contractions 
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
## 2013-10-24 NOTE — Anesthesia Procedure Notes (Signed)
Epidural Patient location during procedure: OB Start time: 10/24/2013 5:52 AM  Staffing Performed by: anesthesiologist   Preanesthetic Checklist Completed: patient identified, site marked, surgical consent, pre-op evaluation, timeout performed, IV checked, risks and benefits discussed and monitors and equipment checked  Epidural Patient position: sitting Prep: site prepped and draped and DuraPrep Patient monitoring: continuous pulse ox and blood pressure Approach: midline Injection technique: LOR air  Needle:  Needle type: Tuohy  Needle gauge: 17 G Needle length: 9 cm and 9 Needle insertion depth: 4.5 cm Catheter type: closed end flexible Catheter size: 19 Gauge Catheter at skin depth: 9.5 cm Test dose: negative  Assessment Events: blood not aspirated, injection not painful, no injection resistance, negative IV test and no paresthesia  Additional Notes Discussed risk of headache, infection, bleeding, nerve injury and failed or incomplete block.  Patient voices understanding and wishes to proceed.  Epidural placed easily on first attempt.  No paresthesia.  Patient tolerated procedure well with no apparent complications.  Jasmine December, MDReason for block:procedure for pain

## 2013-10-24 NOTE — Progress Notes (Addendum)
  Subjective: Patient resting in bed.  No increase in rectal pressure and continues to deny urge to push.   Objective: BP 123/76  Pulse 107  Temp(Src) 97.6 F (36.4 C) (Axillary)  Resp 20  Ht 5\' 5"  (1.651 m)  Wt 179 lb (81.194 kg)  BMI 29.79 kg/m2  SpO2 99%   Total I/O In: -  Out: 850 [Urine:850]  FHT:  135 bpm, Mod Var, Early Decels, +Accels UC:   Q 2-3 min, moderate to palpation SVE:   Dilation: 10 Effacement (%): 100 Station: +2 Exam by:: Sabas Sous CNM Membranes: AROM-particulate mec. noted Pitocin: None  Assessment:  IUP at 40.5wks Cat I FT  Meconium Stained Fluid 2nd Stage Labor  Plan: -High fowlers, await or until urge to push -NICU to be present for delivery -Anticipate SVD -Dr. Kathie Rhodes. [place] updated  Amanda Boys, MSN 10/24/2013, 9:44 AM

## 2013-10-24 NOTE — Progress Notes (Signed)
  Subjective: Patient having some discomfort with epidural, in lower abdominal area.  Denies increased rectal pressure or urge to push.    Objective: BP 101/63  Pulse 103  Temp(Src) 98.1 F (36.7 C) (Oral)  Resp 18  Ht 5\' 5"  (1.651 m)  Wt 179 lb (81.194 kg)  BMI 29.79 kg/m2  SpO2 99%      FHT:  125 bpm, Mod Var, + Early Decels, +Accels UC:   Q 2-82minutes, moderate to palpation SVE:   Deferred Membranes: AROM at 0645 Pitocin: None  Assessment:  IUP at 40.5wks Cat I FT  Active Labor  Plan: -Continue present mgmt -Await for increased rectal pressure and/or urge to push -Anticipate SVD  Barbaraann Boys, MSN 10/24/2013, 7:27 AM

## 2013-10-24 NOTE — Anesthesia Postprocedure Evaluation (Signed)
  Anesthesia Post-op Note  Patient: Amanda [name]  Procedure(s) Performed: * No procedures listed *  Patient Location: Mother/Baby  Anesthesia Type:Epidural  Level of Consciousness: awake  Airway and Oxygen Therapy: Patient Spontanous Breathing  Post-op Pain: mild  Post-op Assessment: Patient's Cardiovascular Status Stable and Respiratory Function Stable  Post-op Vital Signs: stable  Last Vitals:  Filed Vitals:   10/24/13 1500  BP: 109/75  Pulse: 94  Temp: 36.9 C  Resp: 18    Complications: No apparent anesthesia complications

## 2013-10-24 NOTE — Lactation Note (Signed)
This note was copied from the chart of Amanda [name]. Lactation Consultation Note  Patient Name: Amanda [name] Today's Date: 10/24/2013 Reason for consult: Initial assessment of this second-time mom and her newborn, now 33 hours old.  Mom is experienced breastfeeding multipara who nursed and then continued pumping to provide breast milk for one year with her older child, now 79 months old.  New baby is latching well with consistent LATCH scores=10 and mom denies any concerns.  Mom states she knows how to hand express her colostrum as needed.  LC encouraged STS and cue feedings.  Mom encouraged to feed baby 8-12 times/24 hours and with feeding cues. LC encouraged review of Baby and Me pp 9, 14 and 20-25 for STS and BF information. LC provided [employer] brochure and reviewed [facility] services and list of community and web site resources.    Maternal Data Formula Feeding for Exclusion: No Infant to breast within first hour of birth: Yes Has patient been taught Hand Expression?: Yes (mom states she knows how to hand express colostrum) Does the patient have breastfeeding experience prior to this delivery?: Yes  Feeding    LATCH Score/Interventions            LATCH scores=10          Lactation Tools Discussed/Used   STS, cue feedings, hand expression  Consult Status Consult Status: Follow-up Date: 10/25/13 Follow-up type: In-patient    Zara Chess 10/24/2013, 11:01 PM

## 2013-10-25 LAB — CBC
HCT: 31 % — ABNORMAL LOW (ref 36.0–46.0)
Hemoglobin: 10.4 g/dL — ABNORMAL LOW (ref 12.0–15.0)
MCH: 30 pg (ref 26.0–34.0)
MCHC: 33.5 g/dL (ref 30.0–36.0)
MCV: 89.3 fL (ref 78.0–100.0)
Platelets: 204 10*3/uL (ref 150–400)
RBC: 3.47 MIL/uL — AB (ref 3.87–5.11)
RDW: 14.1 % (ref 11.5–15.5)
WBC: 13.5 10*3/uL — AB (ref 4.0–10.5)

## 2013-10-25 NOTE — Lactation Note (Signed)
This note was copied from the chart of Amanda [name]. Lactation Consultation Note: Mom has called out to desk requesting a NS. Reports that her nipples are very sore because the baby has been feeding so much. Observed latch- mom not supporting breast and baby falls off after a few sucks. Encouraged to support breast throughout feeding. Mom complains of pain and wants NS. Used NS with first baby. Discussed pros and cons of NS use. Mom still wants NS. #24 applied and mom reports that feels much better. Comfort gels given with instructions. No questions at present. To call for assist prn  Patient Name: Amanda Floetta Chiappa [id number] Date: 10/25/2013 Reason for consult: Follow-up assessment   Maternal Data Formula Feeding for Exclusion: No Has patient been taught Hand Expression?: Yes Does the patient have breastfeeding experience prior to this delivery?: Yes  Feeding Feeding Type: Breast Fed Length of feed: 30 min  LATCH Score/Interventions Latch: Grasps breast easily, tongue down, lips flanged, rhythmical sucking.  Audible Swallowing: None  Type of Nipple: Everted at rest and after stimulation  Comfort (Breast/Nipple): Filling, red/small blisters or bruises, mild/mod discomfort  Problem noted: Mild/Moderate discomfort Interventions (Mild/moderate discomfort): Comfort gels        Lactation Tools Discussed/Used Tools: Nipple Shields Nipple shield size: 24   Consult Status Consult Status: Follow-up Date: 10/26/13 Follow-up type: In-patient    Pamelia Hoit 10/25/2013, 9:18 AM

## 2013-10-26 MED ORDER — NORETHINDRONE 0.35 MG PO TABS: 1.0000 | ORAL_TABLET | Freq: Every day | ORAL | Status: DC

## 2013-10-26 MED ORDER — IBUPROFEN 600 MG PO TABS: 600.0000 mg | ORAL_TABLET | Freq: Four times a day (QID) | ORAL | Status: DC | PRN

## 2013-10-26 MED ORDER — OXYCODONE-ACETAMINOPHEN 5-325 MG PO TABS: 1.0000 | ORAL_TABLET | ORAL | Status: DC | PRN

## 2013-10-26 NOTE — Discharge Summary (Signed)
Vaginal Delivery Discharge Summary  Amanda RoofSabrina [name]  DOB:    12/02/1985 MRN:    [id number] CSN:    [id number]  Date of admission:                  10/24/13  Date of discharge:                   10/26/13  Procedures this admission: SVD with a 1st degree lac  Date of Delivery: 10/24/13 by Amanda HeckJessica [name]  Newborn Data:  Live born female  Birth Weight: 8 lb 6.9 oz (3825 g) APGAR: 7, 9  Home with mother. Name: "Amanda [name]" Circumcision Plan: n/a  History of Present Illness:  [name] is a 28 y.o. female, [id number], who presents at 2163w5d weeks gestation. [facility]. She was admitted onset of labor. Her pregnancy has been complicated by:   Patient Active Problem List   Diagnosis Date Noted  . GBS (group B Streptococcus carrier), +RV culture, currently pregnant 10/24/2013  . First-degree perineal laceration, with delivery 10/24/2013  . SVD (spontaneous vaginal delivery) 10/24/2013  . MVP (mitral valve prolapse)--hx, but negative cardiac w/u during pregancy 04/09/2012  . S/P LEEP 04/09/2012     Hospital course:  The patient was admitted for labor.   Her labor was not complicated. She proceeded to have a vaginal delivery of a healthy infant. Her delivery was not complicated. Her postpartum course was not complicated.  She was discharged to home on postpartum day 2 doing well.  Feeding:  breast  Contraception:  oral progesterone-only contraceptive  Discharge hemoglobin:  Hemoglobin  Date Value Ref Range Status  10/25/2013 10.4* 12.0 - 15.0 g/dL Final     REPEATED TO VERIFY     DELTA CHECK NOTED  08/17/2011 15.3   Final     HCT  Date Value Ref Range Status  10/25/2013 31.0* 36.0 - 46.0 % Final  08/17/2011 47   Final    Discharge Physical Exam:   General: alert, cooperative and no distress Lochia: appropriate Uterine Fundus: firm Incision: healing well DVT  Evaluation: No evidence of DVT seen on physical exam. Negative Homan's sign.  Intrapartum Procedures: spontaneous vaginal delivery and GBS prophylaxis Postpartum Procedures: none Complications-Operative and Postpartum: 1st degree perineal laceration  Discharge Diagnoses: Term Pregnancy-delivered and asymptomatic anemia  Discharge Information:  Activity:           pelvic rest Diet:                routine Medications: PNV, Ibuprofen, Percocet and micronor Condition:      stable Instructions:   Postpartum Care After Vaginal Delivery  After you deliver your newborn (postpartum period), the usual stay in the hospital is 24 72 hours. If there were problems with your labor or delivery, or if you have other medical problems, you might be in the hospital longer.  While you are in the hospital, you will receive help and instructions on how to care for yourself and your newborn during the postpartum period.  While you are in the hospital:  Be sure to tell your nurses if you have pain or discomfort, as well as where you feel the pain and what makes the pain worse.  If you had an incision made near your vagina (episiotomy) or if you had some tearing during delivery, the nurses may put ice packs on your episiotomy or tear. The ice  packs may help to reduce the pain and swelling.  If you are breastfeeding, you may feel uncomfortable contractions of your uterus for a couple of weeks. This is normal. The contractions help your uterus get back to normal size.  It is normal to have some bleeding after delivery.  For the first 1 3 days after delivery, the flow is red and the amount may be similar to a period.  It is common for the flow to start and stop.  In the first few days, you may pass some small clots. Let your nurses know if you begin to pass large clots or your flow increases.  Do not  flush blood clots down the toilet before having the nurse look at them.  During the next 3 10 days after  delivery, your flow should become more watery and pink or brown-tinged in color.  Ten to fourteen days after delivery, your flow should be a small amount of yellowish-white discharge.  The amount of your flow will decrease over the first few weeks after delivery. Your flow may stop in 6 8 weeks. Most women have had their flow stop by 12 weeks after delivery.  You should change your sanitary pads frequently.  Wash your hands thoroughly with soap and water for at least 20 seconds after changing pads, using the toilet, or before holding or feeding your newborn.  You should feel like you need to empty your bladder within the first 6 8 hours after delivery.  In case you become weak, lightheaded, or faint, call your nurse before you get out of bed for the first time and before you take a shower for the first time.  Within the first few days after delivery, your breasts may begin to feel tender and full. This is called engorgement. Breast tenderness usually goes away within 48 72 hours after engorgement occurs. You may also notice milk leaking from your breasts. If you are not breastfeeding, do not stimulate your breasts. Breast stimulation can make your breasts produce more milk.  Spending as much time as possible with your newborn is very important. During this time, you and your newborn can feel close and get to know each other. Having your newborn stay in your room (rooming in) will help to strengthen the bond with your newborn. It will give you time to get to know your newborn and become comfortable caring for your newborn.  Your hormones change after delivery. Sometimes the hormone changes can temporarily cause you to feel sad or tearful. These feelings should not last more than a few days. If these feelings last longer than that, you should talk to your caregiver.  If desired, talk to your caregiver about methods of family planning or contraception.  Talk to your caregiver about immunizations.  Your caregiver may want you to have the following immunizations before leaving the hospital:  Tetanus, diphtheria, and pertussis (Tdap) or tetanus and diphtheria (Td) immunization. It is very important that you and your family (including grandparents) or others caring for your newborn are up-to-date with the Tdap or Td immunizations. The Tdap or Td immunization can help protect your newborn from getting ill.  Rubella immunization.  Varicella (chickenpox) immunization.  Influenza immunization. You should receive this annual immunization if you did not receive the immunization during your pregnancy. Document Released: 03/05/2007 Document Revised: 01/31/2012 Document Reviewed: 01/03/2012 [facility] Patient Information 2014 [place], [place].   Postpartum Depression and Baby Blues  The postpartum period begins right after the birth of a  baby. During this time, there is often a great amount of joy and excitement. It is also a time of considerable changes in the life of the parent(s). Regardless of how many times a mother gives birth, each child brings new challenges and dynamics to the family. It is not unusual to have feelings of excitement accompanied by confusing shifts in moods, emotions, and thoughts. All mothers are at risk of developing postpartum depression or the "baby blues." These mood changes can occur right after giving birth, or they may occur many months after giving birth. The baby blues or postpartum depression can be mild or severe. Additionally, postpartum depression can resolve rather quickly, or it can be a long-term condition. CAUSES Elevated hormones and their rapid decline are thought to be a main cause of postpartum depression and the baby blues. There are a number of hormones that radically change during and after pregnancy. Estrogen and progesterone usually decrease immediately after delivering your baby. The level of thyroid hormone and various cortisol steroids also rapidly drop.  Other factors that play a major role in these changes include major life events and genetics.  RISK FACTORS If you have any of the following risks for the baby blues or postpartum depression, know what symptoms to watch out for during the postpartum period. Risk factors that may increase the likelihood of getting the baby blues or postpartum depression include:  Havinga personal or family history of depression.  Having depression while being pregnant.  Having premenstrual or oral contraceptive-associated mood issues.  Having exceptional life stress.  Having marital conflict.  Lacking a social support network.  Having a baby with special needs.  Having health problems such as diabetes. SYMPTOMS Baby blues symptoms include:  Brief fluctuations in mood, such as going from extreme happiness to sadness.  Decreased concentration.  Difficulty sleeping.  Crying spells, tearfulness.  Irritability.  Anxiety. Postpartum depression symptoms typically begin within the first month after giving birth. These symptoms include:  Difficulty sleeping or excessive sleepiness.  Marked weight loss.  Agitation.  Feelings of worthlessness.  Lack of interest in activity or food. Postpartum psychosis is a very concerning condition and can be dangerous. Fortunately, it is rare. Displaying any of the following symptoms is cause for immediate medical attention. Postpartum psychosis symptoms include:  Hallucinations and delusions.  Bizarre or disorganized behavior.  Confusion or disorientation. DIAGNOSIS  A diagnosis is made by an evaluation of your symptoms. There are no medical or lab tests that lead to a diagnosis, but there are various questionnaires that a caregiver may use to identify those with the baby blues, postpartum depression, or psychosis. Often times, a screening tool called the [place] Postnatal Depression Scale is used to diagnose depression in the postpartum period.   TREATMENT The baby blues usually goes away on its own in 1 to 2 weeks. Social support is often all that is needed. You should be encouraged to get adequate sleep and rest. Occasionally, you may be given medicines to help you sleep.  Postpartum depression requires treatment as it can last several months or longer if it is not treated. Treatment may include individual or group therapy, medicine, or both to address any social, physiological, and psychological factors that may play a role in the depression. Regular exercise, a healthy diet, rest, and social support may also be strongly recommended.  Postpartum psychosis is more serious and needs treatment right away. Hospitalization is often needed. HOME CARE INSTRUCTIONS  Get as much rest as you can. Nap  when the baby sleeps.  Exercise regularly. Some women find yoga and walking to be beneficial.  Eat a balanced and nourishing diet.  Do little things that you enjoy. Have a cup of tea, take a bubble bath, read your favorite magazine, or listen to your favorite music.  Avoid alcohol.  Ask for help with household chores, cooking, grocery shopping, or running errands as needed. Do not try to do everything.  Talk to people close to you about how you are feeling. Get support from your partner, family members, friends, or other new moms.  Try to stay positive in how you think. Think about the things you are grateful for.  Do not spend a lot of time alone.  Only take medicine as directed by your caregiver.  Keep all your postpartum appointments.  Let your caregiver know if you have any concerns. SEEK MEDICAL CARE IF: You are having a reaction or problems with your medicine. SEEK IMMEDIATE MEDICAL CARE IF:  You have suicidal feelings.  You feel you may harm the baby or someone else. Document Released: 02/10/2004 Document Revised: 07/31/2011 Document Reviewed: 03/14/2011 [facility] Patient Information 2014 [place], [place].   Discharge to:  home  Follow-up Information   Follow up with [facility]. Schedule an appointment as soon as possible for a visit in 6 weeks. (Call with any questions or concerns)    Specialty:  Obstetrics and Gynecology   Contact information:   [address] [place] [address] [phone]       Amanda [name] 10/26/2013

## 2013-10-26 NOTE — Progress Notes (Signed)
Amanda [name]  Post Partum Day 1: S/P SVD with a 1st degree lac  Subjective: Patient up ad lib, denies syncope or dizziness.  Voiding without difficulty, pain well manage. Feeding:  Breastfeeding Contraceptive plan:   Micronor  Objective: Blood pressure 100/68, pulse 75, temperature 98.1 F (36.7 C), temperature source Oral, resp. rate 18, height 5\' 5"  (1.651 m), weight 179 lb (81.194 kg), SpO2 99.00%, unknown if currently breastfeeding.  Physical Exam:  General: alert, cooperative and no distress Lochia: appropriate Uterine Fundus: firm Incision: healing well DVT Evaluation: No evidence of DVT seen on physical exam. Negative Homan's sign.   Recent Labs  10/24/13 0415 10/25/13 0623  HGB 12.6 10.4*  HCT 36.4 31.0*    A/P S/P Vaginal delivery day 1 Asymptomatic Anemia Continue current care Plan for discharge tomorrow     Haroldine Laws, CNM 10/26/2013, 2:21 PM

## 2013-10-26 NOTE — Discharge Instructions (Signed)
Postpartum Care After Vaginal Delivery °After you deliver your newborn (postpartum period), the usual stay in the hospital is 24 72 hours. If there were problems with your labor or delivery, or if you have other medical problems, you might be in the hospital longer.  °While you are in the hospital, you will receive help and instructions on how to care for yourself and your newborn during the postpartum period.  °While you are in the hospital: °· Be sure to tell your nurses if you have pain or discomfort, as well as where you feel the pain and what makes the pain worse. °· If you had an incision made near your vagina (episiotomy) or if you had some tearing during delivery, the nurses may put ice packs on your episiotomy or tear. The ice packs may help to reduce the pain and swelling. °· If you are breastfeeding, you may feel uncomfortable contractions of your uterus for a couple of weeks. This is normal. The contractions help your uterus get back to normal size. °· It is normal to have some bleeding after delivery. °· For the first 1 3 days after delivery, the flow is red and the amount may be similar to a period. °· It is common for the flow to start and stop. °· In the first few days, you may pass some small clots. Let your nurses know if you begin to pass large clots or your flow increases. °· Do not  flush blood clots down the toilet before having the nurse look at them. °· During the next 3 10 days after delivery, your flow should become more watery and pink or brown-tinged in color. °· Ten to fourteen days after delivery, your flow should be a small amount of yellowish-white discharge. °· The amount of your flow will decrease over the first few weeks after delivery. Your flow may stop in 6 8 weeks. Most women have had their flow stop by 12 weeks after delivery. °· You should change your sanitary pads frequently. °· Wash your hands thoroughly with soap and water for at least 20 seconds after changing pads, using  the toilet, or before holding or feeding your newborn. °· You should feel like you need to empty your bladder within the first 6 8 hours after delivery. °· In case you become weak, lightheaded, or faint, call your nurse before you get out of bed for the first time and before you take a shower for the first time. °· Within the first few days after delivery, your breasts may begin to feel tender and full. This is called engorgement. Breast tenderness usually goes away within 48 72 hours after engorgement occurs. You may also notice milk leaking from your breasts. If you are not breastfeeding, do not stimulate your breasts. Breast stimulation can make your breasts produce more milk. °· Spending as much time as possible with your newborn is very important. During this time, you and your newborn can feel close and get to know each other. Having your newborn stay in your room (rooming in) will help to strengthen the bond with your newborn.  It will give you time to get to know your newborn and become comfortable caring for your newborn. °· Your hormones change after delivery. Sometimes the hormone changes can temporarily cause you to feel sad or tearful. These feelings should not last more than a few days. If these feelings last longer than that, you should talk to your caregiver. °· If desired, talk to your caregiver about   methods of family planning or contraception.  Talk to your caregiver about immunizations. Your caregiver may want you to have the following immunizations before leaving the hospital:  Tetanus, diphtheria, and pertussis (Tdap) or tetanus and diphtheria (Td) immunization. It is very important that you and your family (including grandparents) or others caring for your newborn are up-to-date with the Tdap or Td immunizations. The Tdap or Td immunization can help protect your newborn from getting ill.  Rubella immunization.  Varicella (chickenpox) immunization.  Influenza immunization. You should  receive this annual immunization if you did not receive the immunization during your pregnancy. Document Released: 03/05/2007 Document Revised: 01/31/2012 Document Reviewed: 01/03/2012 [facility] Patient Information 2014 [place].  Postpartum Depression and Baby Blues The postpartum period begins right after the birth of a baby. During this time, there is often a great amount of joy and excitement. It is also a time of considerable changes in the life of the parent(s). Regardless of how many times a mother gives birth, each child brings new challenges and dynamics to the family. It is not unusual to have feelings of excitement accompanied by confusing shifts in moods, emotions, and thoughts. All mothers are at risk of developing postpartum depression or the "baby blues." These mood changes can occur right after giving birth, or they may occur many months after giving birth. The baby blues or postpartum depression can be mild or severe. Additionally, postpartum depression can resolve rather quickly, or it can be a long-term condition. CAUSES Elevated hormones and their rapid decline are thought to be a main cause of postpartum depression and the baby blues. There are a number of hormones that radically change during and after pregnancy. Estrogen and progesterone usually decrease immediately after delivering your baby. The level of thyroid hormone and various cortisol steroids also rapidly drop. Other factors that play a major role in these changes include major life events and genetics.  RISK FACTORS If you have any of the following risks for the baby blues or postpartum depression, know what symptoms to watch out for during the postpartum period. Risk factors that may increase the likelihood of getting the baby blues or postpartum depression include:  Havinga personal or family history of depression.  Having depression while being pregnant.  Having premenstrual or oral contraceptive-associated  mood issues.  Having exceptional life stress.  Having marital conflict.  Lacking a social support network.  Having a baby with special needs.  Having health problems such as diabetes. SYMPTOMS Baby blues symptoms include:  Brief fluctuations in mood, such as going from extreme happiness to sadness.  Decreased concentration.  Difficulty sleeping.  Crying spells, tearfulness.  Irritability.  Anxiety. Postpartum depression symptoms typically begin within the first month after giving birth. These symptoms include:  Difficulty sleeping or excessive sleepiness.  Marked weight loss.  Agitation.  Feelings of worthlessness.  Lack of interest in activity or food. Postpartum psychosis is a very concerning condition and can be dangerous. Fortunately, it is rare. Displaying any of the following symptoms is cause for immediate medical attention. Postpartum psychosis symptoms include:  Hallucinations and delusions.  Bizarre or disorganized behavior.  Confusion or disorientation. DIAGNOSIS  A diagnosis is made by an evaluation of your symptoms. There are no medical or lab tests that lead to a diagnosis, but there are various questionnaires that a caregiver may use to identify those with the baby blues, postpartum depression, or psychosis. Often times, a screening tool called the [place] Postnatal Depression Scale is used to diagnose  depression in the postpartum period.  °TREATMENT °The baby blues usually goes away on its own in 1 to 2 weeks. Social support is often all that is needed. You should be encouraged to get adequate sleep and rest. Occasionally, you may be given medicines to help you sleep.  °Postpartum depression requires treatment as it can last several months or longer if it is not treated. Treatment may include individual or group therapy, medicine, or both to address any social, physiological, and psychological factors that may play a role in the depression. Regular  exercise, a healthy diet, rest, and social support may also be strongly recommended.  °Postpartum psychosis is more serious and needs treatment right away. Hospitalization is often needed. °HOME CARE INSTRUCTIONS °· Get as much rest as you can. Nap when the baby sleeps. °· Exercise regularly. Some women find yoga and walking to be beneficial. °· Eat a balanced and nourishing diet. °· Do little things that you enjoy. Have a cup of tea, take a bubble bath, read your favorite magazine, or listen to your favorite music. °· Avoid alcohol. °· Ask for help with household chores, cooking, grocery shopping, or running errands as needed. Do not try to do everything. °· Talk to people close to you about how you are feeling. Get support from your partner, family members, friends, or other new moms. °· Try to stay positive in how you think. Think about the things you are grateful for. °· Do not spend a lot of time alone. °· Only take medicine as directed by your caregiver. °· Keep all your postpartum appointments. °· Let your caregiver know if you have any concerns. °SEEK MEDICAL CARE IF: °You are having a reaction or problems with your medicine. °SEEK IMMEDIATE MEDICAL CARE IF: °· You have suicidal feelings. °· You feel you may harm the baby or someone else. °Document Released: 02/10/2004 Document Revised: 07/31/2011 Document Reviewed: 02/17/2013 °[employer]® Patient Information ©2014 [employer]. ° °Breastfeeding °Deciding to breastfeed is one of the best choices you can make for you and your baby. A change in hormones during pregnancy causes your breast tissue to grow and increases the number and size of your milk ducts. These hormones also allow proteins, sugars, and fats from your blood supply to make breast milk in your milk-producing glands. Hormones prevent breast milk from being released before your baby is born as well as prompt milk flow after birth. Once breastfeeding has begun, thoughts of your baby, as well as his  or her sucking or crying, can stimulate the release of milk from your milk-producing glands.  °BENEFITS OF BREASTFEEDING °For Your Baby °· Your first milk (colostrum) helps your baby's digestive system function better.   °· There are antibodies in your milk that help your baby fight off infections.   °· Your baby has a lower incidence of asthma, allergies, and sudden infant death syndrome.   °· The nutrients in breast milk are better for your baby than infant formulas and are designed uniquely for your baby's needs.   °· Breast milk improves your baby's brain development.   °· Your baby is less likely to develop other conditions, such as childhood obesity, asthma, or type 2 diabetes mellitus.   °For You  °· Breastfeeding helps to create a very special bond between you and your baby.   °· Breastfeeding is convenient. Breast milk is always available at the correct temperature and costs nothing.   °· Breastfeeding helps to burn calories and helps you lose the weight gained during pregnancy.   °· Breastfeeding makes   your uterus contract to its prepregnancy size faster and slows bleeding (lochia) after you give birth.   Breastfeeding helps to lower your risk of developing type 2 diabetes mellitus, osteoporosis, and breast or ovarian cancer later in life. SIGNS THAT YOUR BABY IS HUNGRY Early Signs of Hunger  Increased alertness or activity.  Stretching.  Movement of the head from side to side.  Movement of the head and opening of the mouth when the corner of the mouth or cheek is stroked (rooting).  Increased sucking sounds, smacking lips, cooing, sighing, or squeaking.  Hand-to-mouth movements.  Increased sucking of fingers or hands. Late Signs of Hunger  Fussing.  Intermittent crying. Extreme Signs of Hunger Signs of extreme hunger will require calming and consoling before your baby will be able to breastfeed successfully. Do not wait for the following signs of extreme hunger to occur before  you initiate breastfeeding:   Restlessness.  A loud, strong cry.   Screaming. BREASTFEEDING BASICS Breastfeeding Initiation  Find a comfortable place to sit or lie down, with your neck and back well supported.  Place a pillow or rolled up blanket under your baby to bring him or her to the level of your breast (if you are seated). Nursing pillows are specially designed to help support your arms and your baby while you breastfeed.  Make sure that your baby's abdomen is facing your abdomen.   Gently massage your breast. With your fingertips, massage from your chest wall toward your nipple in a circular motion. This encourages milk flow. You may need to continue this action during the feeding if your milk flows slowly.  Support your breast with 4 fingers underneath and your thumb above your nipple. Make sure your fingers are well away from your nipple and your baby's mouth.   Stroke your baby's lips gently with your finger or nipple.   When your baby's mouth is open wide enough, quickly bring your baby to your breast, placing your entire nipple and as much of the colored area around your nipple (areola) as possible into your baby's mouth.   More areola should be visible above your baby's upper lip than below the lower lip.   Your baby's tongue should be between his or her lower gum and your breast.   Ensure that your baby's mouth is correctly positioned around your nipple (latched). Your baby's lips should create a seal on your breast and be turned out (everted).  It is common for your baby to suck about 2 3 minutes in order to start the flow of breast milk. Latching Teaching your baby how to latch on to your breast properly is very important. An improper latch can cause nipple pain and decreased milk supply for you and poor weight gain in your baby. Also, if your baby is not latched onto your nipple properly, he or she may swallow some air during feeding. This can make your baby  fussy. Burping your baby when you switch breasts during the feeding can help to get rid of the air. However, teaching your baby to latch on properly is still the best way to prevent fussiness from swallowing air while breastfeeding. Signs that your baby has successfully latched on to your nipple:    Silent tugging or silent sucking, without causing you pain.   Swallowing heard between every 3 4 sucks.    Muscle movement above and in front of his or her ears while sucking.  Signs that your baby has not successfully latched on  to nipple:   Sucking sounds or smacking sounds from your baby while breastfeeding.  Nipple pain. If you think your baby has not latched on correctly, slip your finger into the corner of your baby's mouth to break the suction and place it between your baby's gums. Attempt breastfeeding initiation again. Signs of Successful Breastfeeding Signs from your baby:   A gradual decrease in the number of sucks or complete cessation of sucking.   Falling asleep.   Relaxation of his or her body.   Retention of a small amount of milk in his or her mouth.   Letting go of your breast by himself or herself. Signs from you:  Breasts that have increased in firmness, weight, and size 1 3 hours after feeding.   Breasts that are softer immediately after breastfeeding.  Increased milk volume, as well as a change in milk consistency and color by the 5th day of breastfeeding.   Nipples that are not sore, cracked, or bleeding. Signs That Your [employer] is Getting Enough Milk  Wetting at least 3 diapers in a 24-hour period. The urine should be clear and pale yellow by age 511 days.  At least 3 stools in a 24-hour period by age 511 days. The stool should be soft and yellow.  At least 3 stools in a 24-hour period by age 27 days. The stool should be seedy and yellow.  No loss of weight greater than 10% of birth weight during the first 25 days of age.  Average weight gain of 4 7  ounces (120 210 mL) per week after age 51 days.  Consistent daily weight gain by age 8 days, without weight loss after the age of 2 weeks. After a feeding, your baby may spit up a small amount. This is common. BREASTFEEDING FREQUENCY AND DURATION Frequent feeding will help you make more milk and can prevent sore nipples and breast engorgement. Breastfeed when you feel the need to reduce the fullness of your breasts or when your baby shows signs of hunger. This is called "breastfeeding on demand." Avoid introducing a pacifier to your baby while you are working to establish breastfeeding (the first 4 6 weeks after your baby is born). After this time you may choose to use a pacifier. Research has shown that pacifier use during the first year of a baby's life decreases the risk of sudden infant death syndrome (SIDS). Allow your baby to feed on each breast as long as he or she wants. Breastfeed until your baby is finished feeding. When your baby unlatches or falls asleep while feeding from the first breast, offer the second breast. Because newborns are often sleepy in the first few weeks of life, you may need to awaken your baby to get him or her to feed. Breastfeeding times will vary from baby to baby. However, the following rules can serve as a guide to help you ensure that your baby is properly fed:  Newborns (babies 57 weeks of age or younger) may breastfeed every 1 3 hours.  Newborns should not go longer than 3 hours during the day or 5 hours during the night without breastfeeding.  You should breastfeed your baby a minimum of 8 times in a 24-hour period until you begin to introduce solid foods to your baby at around 87 months of age. BREAST MILK PUMPING Pumping and storing breast milk allows you to ensure that your baby is exclusively fed your breast milk, even at times when you are unable to breastfeed. This  is especially important if you are going back to work while you are still breastfeeding or when  you are not able to be present during feedings. Your lactation consultant can give you guidelines on how long it is safe to store breast milk.  A breast pump is a machine that allows you to pump milk from your breast into a sterile bottle. The pumped breast milk can then be stored in a refrigerator or freezer. Some breast pumps are operated by hand, while others use electricity. Ask your lactation consultant which type will work best for you. Breast pumps can be purchased, but some hospitals and breastfeeding support groups lease breast pumps on a monthly basis. A lactation consultant can teach you how to hand express breast milk, if you prefer not to use a pump.  CARING FOR YOUR BREASTS WHILE YOU BREASTFEED Nipples can become dry, cracked, and sore while breastfeeding. The following recommendations can help keep your breasts moisturized and healthy:  Avoid using soap on your nipples.   Wear a supportive bra. Although not required, special nursing bras and tank tops are designed to allow access to your breasts for breastfeeding without taking off your entire bra or top. Avoid wearing underwire style bras or extremely tight bras.  Air dry your nipples for 3 5mnutes after each feeding.   Use only cotton bra pads to absorb leaked breast milk. Leaking of breast milk between feedings is normal.   Use lanolin on your nipples after breastfeeding. Lanolin helps to maintain your skin's normal moisture barrier. If you use pure lanolin you do not need to wash it off before feeding your baby again. Pure lanolin is not toxic to your baby. You may also hand express a few drops of breast milk and gently massage that milk into your nipples and allow the milk to air dry. In the first few weeks after giving birth, some women experience extremely full breasts (engorgement). Engorgement can make your breasts feel heavy, warm, and tender to the touch. Engorgement peaks within 3 5 days after you give birth. The  following recommendations can help ease engorgement:  Completely empty your breasts while breastfeeding or pumping. You may want to start by applying warm, moist heat (in the shower or with warm water-soaked hand towels) just before feeding or pumping. This increases circulation and helps the milk flow. If your baby does not completely empty your breasts while breastfeeding, pump any extra milk after he or she is finished.  Wear a snug bra (nursing or regular) or tank top for 1 2 days to signal your body to slightly decrease milk production.  Apply ice packs to your breasts, unless this is too uncomfortable for you.  Make sure that your baby is latched on and positioned properly while breastfeeding. If engorgement persists after 48 hours of following these recommendations, contact your health care provider or a lScience writer OVERALL HEALTH CARE RECOMMENDATIONS WHILE BREASTFEEDING  Eat healthy foods. Alternate between meals and snacks, eating 3 of each per day. Because what you eat affects your breast milk, some of the foods may make your baby more irritable than usual. Avoid eating these foods if you are sure that they are negatively affecting your baby.  Drink milk, fruit juice, and water to satisfy your thirst (about 10 glasses a day).   Rest often, relax, and continue to take your prenatal vitamins to prevent fatigue, stress, and anemia.  Continue breast self-awareness checks.  Avoid chewing and smoking tobacco.  Avoid alcohol and  drug use. Some medicines that may be harmful to your baby can pass through breast milk. It is important to ask your health care provider before taking any medicine, including all over-the-counter and prescription medicine as well as vitamin and herbal supplements. It is possible to become pregnant while breastfeeding. If birth control is desired, ask your health care provider about options that will be safe for your baby. SEEK MEDICAL CARE IF:   You  feel like you want to stop breastfeeding or have become frustrated with breastfeeding.  You have painful breasts or nipples.  Your nipples are cracked or bleeding.  Your breasts are red, tender, or warm.  You have a swollen area on either breast.  You have a fever or chills.  You have nausea or vomiting.  You have drainage other than breast milk from your nipples.  Your breasts do not become full before feedings by the 5th day after you give birth.  You feel sad and depressed.  Your baby is too sleepy to eat well.  Your baby is having trouble sleeping.   Your baby is wetting less than 3 diapers in a 24-hour period.  Your baby has less than 3 stools in a 24-hour period.  Your baby's skin or the white part of his or her eyes becomes yellow.   Your baby is not gaining weight by 50 days of age. SEEK IMMEDIATE MEDICAL CARE IF:   Your baby is overly tired (lethargic) and does not want to wake up and feed.  Your baby develops an unexplained fever. Document Released: 05/08/2005 Document Revised: 01/08/2013 Document Reviewed: 10/30/2012 [facility] Patient Information 2014 [place], [place].  Norethindrone tablets (contraception) What is this medicine? NORETHINDRONE (nor eth IN drone) is an oral contraceptive. The product contains a female hormone known as a progestin. It is used to prevent pregnancy. This medicine may be used for other purposes; ask your health care provider or pharmacist if you have questions. COMMON BRAND NAME(S): Camila, [place]in , [place], [place], [place] , [place], Nor-QD, Nora-BE, Ortho Micronor What should I tell my health care provider before I take this medicine? They need to know if you have any of these conditions: -blood vessel disease or blood clots -breast, cervical, or vaginal cancer -diabetes -heart disease -kidney disease -liver disease -mental depression -migraine -seizures -stroke -vaginal bleeding -an unusual or allergic reaction to  norethindrone, other medicines, foods, dyes, or preservatives -pregnant or trying to get pregnant -breast-feeding How should I use this medicine? Take this medicine by mouth with a glass of water. You may take it with or without food. Follow the directions on the prescription label. Take this medicine at the same time each day and in the order directed on the package. Do not take your medicine more often than directed. Contact your pediatrician regarding the use of this medicine in children. Special care may be needed. This medicine has been used in female children who have started having menstrual periods. A patient package insert for the product will be given with each prescription and refill. Read this sheet carefully each time. The sheet may change frequently. Overdosage: If you think you have taken too much of this medicine contact a poison control center or emergency room at once. NOTE: This medicine is only for you. Do not share this medicine with others. What if I miss a dose? Try not to miss a dose. Every time you miss a dose or take a dose late your chance of pregnancy increases. When 1 pill is  missed (even if only 3 hours late), take the missed pill as soon as possible and continue taking a pill each day at the regular time (use a back up method of birth control for the next 48 hours). If more than 1 dose is missed, use an additional birth control method for the rest of your pill pack until menses occurs. Contact your health care professional if more than 1 dose has been missed. What may interact with this medicine? Do not take this medicine with any of the following medications: -amprenavir or fosamprenavir -bosentan This medicine may also interact with the following medications: -antibiotics or medicines for infections, especially rifampin, rifabutin, rifapentine, and griseofulvin, and possibly penicillins or tetracyclines -aprepitant -barbiturate medicines, such as  phenobarbital -carbamazepine -felbamate -modafinil -oxcarbazepine -phenytoin -ritonavir or other medicines for HIV infection or AIDS -St. John's wort -topiramate This list may not describe all possible interactions. Give your health care provider a list of all the medicines, herbs, non-prescription drugs, or dietary supplements you use. Also tell them if you smoke, drink alcohol, or use illegal drugs. Some items may interact with your medicine. What should I watch for while using this medicine? Visit your doctor or health care professional for regular checks on your progress. You will need a regular breast and pelvic exam and Pap smear while on this medicine. Use an additional method of birth control during the first cycle that you take these tablets. If you have any reason to think you are pregnant, stop taking this medicine right away and contact your doctor or health care professional. If you are taking this medicine for hormone related problems, it may take several cycles of use to see improvement in your condition. This medicine does not protect you against HIV infection (AIDS) or any other sexually transmitted diseases. What side effects may I notice from receiving this medicine? Side effects that you should report to your doctor or health care professional as soon as possible: -breast tenderness or discharge -pain in the abdomen, chest, groin or leg -severe headache -skin rash, itching, or hives -sudden shortness of breath -unusually weak or tired -vision or speech problems -yellowing of skin or eyes Side effects that usually do not require medical attention (report to your doctor or health care professional if they continue or are bothersome): -changes in sexual desire -change in menstrual flow -facial hair growth -fluid retention and swelling -headache -irritability -nausea -weight gain or loss This list may not describe all possible side effects. Call your doctor for  medical advice about side effects. You may report side effects to FDA at 1-800-FDA-1088. Where should I keep my medicine? Keep out of the reach of children. Store at room temperature between 15 and 30 degrees C (59 and 86 degrees F). Throw away any unused medicine after the expiration date. NOTE: This sheet is a summary. It may not cover all possible information. If you have questions about this medicine, talk to your doctor, pharmacist, or health care provider.  2014, Elsevier/Gold Standard. (2012-01-26 16:41:35)  Iron-Rich Diet An iron-rich diet contains foods that are good sources of iron. Iron is an important mineral that helps your body produce hemoglobin. Hemoglobin is a protein in red blood cells that carries oxygen to the body's tissues. Sometimes, the iron level in your blood can be low. This may be caused by:  A lack of iron in your diet.  Blood loss.  Times of growth, such as during pregnancy or during a child's growth and development. Low levels  of iron can cause a decrease in the number of red blood cells. This can result in iron deficiency anemia. Iron deficiency anemia symptoms include:  Tiredness.  Weakness.  Irritability.  Increased chance of infection. Here are some recommendations for daily iron intake:  Males older than 30 years of age need 8 mg of iron per day.  Women ages 62 to 73 need 18 mg of iron per day.  Pregnant women need 27 mg of iron per day, and women who are over 66 years of age and breastfeeding need 9 mg of iron per day.  Women over the age of 78 need 8 mg of iron per day. SOURCES OF IRON There are 2 types of iron that are found in food: heme iron and nonheme iron. Heme iron is absorbed by the body better than nonheme iron. Heme iron is found in meat, poultry, and fish. Nonheme iron is found in grains, beans, and vegetables. Heme Iron Sources Food / Iron (mg)  Chicken liver, 3 oz (85 g)/ 10 mg  Beef liver, 3 oz (85 g)/ 5.5 mg  Oysters, 3  oz (85 g)/ 8 mg  Beef, 3 oz (85 g)/ 2 to 3 mg  Shrimp, 3 oz (85 g)/ 2.8 mg  [place], 3 oz (85 g)/ 2 mg  Chicken, 3 oz (85 g) / 1 mg  Fish (tuna, halibut), 3 oz (85 g)/ 1 mg  Pork, 3 oz (85 g)/ 0.9 mg Nonheme Iron Sources Food / Iron (mg)  Ready-to-eat breakfast cereal, iron-fortified / 3.9 to 7 mg  Tofu,  cup / 3.4 mg  Kidney beans,  cup / 2.6 mg  Baked potato with skin / 2.7 mg  Asparagus,  cup / 2.2 mg  Avocado / 2 mg  Dried peaches,  cup / 1.6 mg  Raisins,  cup / 1.5 mg  Soy milk, 1 cup / 1.5 mg  Whole-wheat bread, 1 slice / 1.2 mg  Spinach, 1 cup / 0.8 mg  Broccoli,  cup / 0.6 mg IRON ABSORPTION Certain foods can decrease the body's absorption of iron. Try to avoid these foods and beverages while eating meals with iron-containing foods:  Coffee.  Tea.  Fiber.  Soy. Foods containing vitamin C can help increase the amount of iron your body absorbs from iron sources, especially from nonheme sources. Eat foods with vitamin C along with iron-containing foods to increase your iron absorption. Foods that are high in vitamin C include many fruits and vegetables. Some good sources are:  Fresh orange juice.  Oranges.  Strawberries.  Mangoes.  Grapefruit.  Red bell peppers.  Green bell peppers.  Broccoli.  Potatoes with skin.  Tomato juice. Document Released: 12/20/2004 Document Revised: 07/31/2011 Document Reviewed: 10/27/2010 [facility] Patient Information 2014 [place], [place].

## 2013-10-26 NOTE — Lactation Note (Signed)
This note was copied from the chart of Amanda [name]. Lactation Consultation Note  Patient Name: Amanda [name] DHWYS'H Date: 10/26/2013 Reason for consult: Follow-up assessment Per mom for some feedings I use the Nipple shield due to soreness and for some I don't. Baby feeding on the right breast when LC walked in and also released , noted the nipple to be tight in the nipple  Shield when the baby finished. LC mentioned to mom if the baby will latch without the nipple shield using breast compressions  Until depth and comfort obtained to try it , also after breast massage m hand express, pre -pump with hand pump to make the nipple areola  More elastic for a deeper latch. Reviewed sore nipple and engorgement prevention and tx. Mom already has comfort gels, LC instructed on use of  Shells ot help with soreness.  Mother informed of post-discharge support and given phone number to the lactation department, including services for phone call assistance; out-patient appointments; and breastfeeding support group. List of other breastfeeding resources in the community given in the handout. Encouraged mother to call for problems or concerns related to breastfeeding.   Maternal Data    Feeding Feeding Type:  (baby latched on the right with #24 NS with depth ) Length of feed: 30 min (per mom )  LATCH Score/Interventions Latch:  (latched with depth with #24 NS )  Audible Swallowing:  (multiply swallows )  Type of Nipple:  (erect nipple )     Problem noted:  (per mom breast heavier )  Intervention(s): Breastfeeding basics reviewed     Lactation Tools Discussed/Used Tools: Shells;Comfort gels;Pump Nipple shield size: 24 (aalready has NS , ) Shell Type: Inverted   Consult Status Consult Status: Complete    Amanda [name] 10/26/2013, 12:56 PM

## 2013-10-29 ENCOUNTER — Inpatient Hospital Stay (HOSPITAL_COMMUNITY): Admission: RE | Admit: 2013-10-29 | Payer: Federal, State, Local not specified - PPO | Source: Ambulatory Visit

## 2014-03-23 ENCOUNTER — Encounter (HOSPITAL_COMMUNITY): Payer: Self-pay | Admitting: *Deleted

## 2016-05-22 NOTE — L&D Delivery Note (Signed)
Delivery Note At 9:48 PM, on November 11,2018, a viable female "Amanda [name]" was delivered via Vaginal, Spontaneous (Presentation:Left Occiput Anterior with compound right hand resulting in restitution to LOP).  After delivery of head, nuchal cord noted that shoulders and body was delivered through via somersault maneuver. Tactile stimulation given by provider and infant placed on mothers abdomen where nurses continued tactile stimulation.  Infant  APGAR: 9, 9.  Cord clamped, cut, and blood collected. Placenta delivered spontaneously and noted to be intact with 3VC upon inspection.  Vaginal inspection revealed a 1st degree perineal laceration that was repaired with 3-0 vicryl on SH. 5mL of Lidocaine 1% anesthetic necessary and patient tolerated the procedure well. Fundus firm, at the umbilicus, and bleeding small.  Mother hemodynamically stable and infant skin to skin prior to provider exit.  Mother desires condom usage for birth control and opts to breastfeed.  Infant weight at one hour of life: 7lbs 13oz, 20in  Anesthesia:  Epidural, Local for Repair Episiotomy: None Lacerations: 1st degree;Perineal Suture Repair: 3.0 vicryl Est. Blood Loss (mL): 200  Mom to postpartum.  Baby to Couplet care / Skin to Skin.  Cherre RobinsJessica L Tiffine Henigan MSN, CNM 04/01/2017, 10:42 PM

## 2016-09-07 DIAGNOSIS — Z8742 Personal history of other diseases of the female genital tract: Secondary | ICD-10-CM | POA: Insufficient documentation

## 2017-03-30 ENCOUNTER — Encounter (HOSPITAL_COMMUNITY): Payer: Self-pay | Admitting: *Deleted

## 2017-03-30 ENCOUNTER — Inpatient Hospital Stay (EMERGENCY_DEPARTMENT_HOSPITAL)
Admission: AD | Admit: 2017-03-30 | Discharge: 2017-03-30 | Disposition: A | Discharge status: Home / Self Care | Payer: Federal, State, Local not specified - PPO | Source: Ambulatory Visit | Attending: Obstetrics and Gynecology | Admitting: Obstetrics and Gynecology

## 2017-03-30 ENCOUNTER — Inpatient Hospital Stay (HOSPITAL_COMMUNITY): Payer: Federal, State, Local not specified - PPO

## 2017-03-30 DIAGNOSIS — O4693 Antepartum hemorrhage, unspecified, third trimester: Secondary | ICD-10-CM | POA: Diagnosis not present

## 2017-03-30 DIAGNOSIS — O26893 Other specified pregnancy related conditions, third trimester: Secondary | ICD-10-CM | POA: Diagnosis not present

## 2017-03-30 DIAGNOSIS — N898 Other specified noninflammatory disorders of vagina: Secondary | ICD-10-CM | POA: Diagnosis not present

## 2017-03-30 DIAGNOSIS — Z0371 Encounter for suspected problem with amniotic cavity and membrane ruled out: Secondary | ICD-10-CM

## 2017-03-30 DIAGNOSIS — Z3483 Encounter for supervision of other normal pregnancy, third trimester: Secondary | ICD-10-CM | POA: Diagnosis not present

## 2017-03-30 DIAGNOSIS — Z3A39 39 weeks gestation of pregnancy: Secondary | ICD-10-CM

## 2017-03-30 LAB — POCT FERN TEST

## 2017-03-30 NOTE — Discharge Instructions (Signed)
Fetal Movement Counts °Patient Name: ________________________________________________ Patient Due Date: ____________________ °What is a fetal movement count? °A fetal movement count is the number of times that you feel your baby move during a certain amount of time. This may also be called a fetal kick count. A fetal movement count is recommended for every pregnant woman. You may be asked to start counting fetal movements as early as week 28 of your pregnancy. °Pay attention to when your baby is most active. You may notice your baby's sleep and wake cycles. You may also notice things that make your baby move more. You should do a fetal movement count: °· When your baby is normally most active. °· At the same time each day. ° °A good time to count movements is while you are resting, after having something to eat and drink. °How do I count fetal movements? °1. Find a quiet, comfortable area. Sit, or lie down on your side. °2. Write down the date, the start time and stop time, and the number of movements that you felt between those two times. Take this information with you to your health care visits. °3. For 2 hours, count kicks, flutters, swishes, rolls, and jabs. You should feel at least 10 movements during 2 hours. °4. You may stop counting after you have felt 10 movements. °5. If you do not feel 10 movements in 2 hours, have something to eat and drink. Then, keep resting and counting for 1 hour. If you feel at least 4 movements during that hour, you may stop counting. °Contact a health care provider if: °· You feel fewer than 4 movements in 2 hours. °· Your baby is not moving like he or she usually does. °Date: ____________ Start time: ____________ Stop time: ____________ Movements: ____________ °Date: ____________ Start time: ____________ Stop time: ____________ Movements: ____________ °Date: ____________ Start time: ____________ Stop time: ____________ Movements: ____________ °Date: ____________ Start time:  ____________ Stop time: ____________ Movements: ____________ °Date: ____________ Start time: ____________ Stop time: ____________ Movements: ____________ °Date: ____________ Start time: ____________ Stop time: ____________ Movements: ____________ °Date: ____________ Start time: ____________ Stop time: ____________ Movements: ____________ °Date: ____________ Start time: ____________ Stop time: ____________ Movements: ____________ °Date: ____________ Start time: ____________ Stop time: ____________ Movements: ____________ °This information is not intended to replace advice given to you by your health care provider. Make sure you discuss any questions you have with your health care provider. °Document Released: 06/07/2006 Document Revised: 01/05/2016 Document Reviewed: 06/17/2015 °Elsevier Interactive Patient Education © 2018 Elsevier Inc. °Braxton Hicks Contractions °Contractions of the uterus can occur throughout pregnancy, but they are not always a sign that you are in labor. You may have practice contractions called Braxton Hicks contractions. These false labor contractions are sometimes confused with true labor. °What are Braxton Hicks contractions? °Braxton Hicks contractions are tightening movements that occur in the muscles of the uterus before labor. Unlike true labor contractions, these contractions do not result in opening (dilation) and thinning of the cervix. Toward the end of pregnancy (32-34 weeks), Braxton Hicks contractions can happen more often and may become stronger. These contractions are sometimes difficult to tell apart from true labor because they can be very uncomfortable. You should not feel embarrassed if you go to the hospital with false labor. °Sometimes, the only way to tell if you are in true labor is for your health care provider to look for changes in the cervix. The health care provider will do a physical exam and may monitor your contractions. If   you are not in true labor, the exam  should show that your cervix is not dilating and your water has not broken. °If there are no prenatal problems or other health problems associated with your pregnancy, it is completely safe for you to be sent home with false labor. You may continue to have Braxton Hicks contractions until you go into true labor. °How can I tell the difference between true labor and false labor? °· Differences °? False labor °? Contractions last 30-70 seconds.: Contractions are usually shorter and not as strong as true labor contractions. °? Contractions become very regular.: Contractions are usually irregular. °? Discomfort is usually felt in the top of the uterus, and it spreads to the lower abdomen and low back.: Contractions are often felt in the front of the lower abdomen and in the groin. °? Contractions do not go away with walking.: Contractions may go away when you walk around or change positions while lying down. °? Contractions usually become more intense and increase in frequency.: Contractions get weaker and are shorter-lasting as time goes on. °? The cervix dilates and gets thinner.: The cervix usually does not dilate or become thin. °Follow these instructions at home: °· Take over-the-counter and prescription medicines only as told by your health care provider. °· Keep up with your usual exercises and follow other instructions from your health care provider. °· Eat and drink lightly if you think you are going into labor. °· If Braxton Hicks contractions are making you uncomfortable: °? Change your position from lying down or resting to walking, or change from walking to resting. °? Sit and rest in a tub of warm water. °? Drink enough fluid to keep your urine clear or pale yellow. Dehydration may cause these contractions. °? Do slow and deep breathing several times an hour. °· Keep all follow-up prenatal visits as told by your health care provider. This is important. °Contact a health care provider if: °· You have a  fever. °· You have continuous pain in your abdomen. °Get help right away if: °· Your contractions become stronger, more regular, and closer together. °· You have fluid leaking or gushing from your vagina. °· You pass blood-tinged mucus (bloody show). °· You have bleeding from your vagina. °· You have low back pain that you never had before. °· You feel your baby’s head pushing down and causing pelvic pressure. °· Your baby is not moving inside you as much as it used to. °Summary °· Contractions that occur before labor are called Braxton Hicks contractions, false labor, or practice contractions. °· Braxton Hicks contractions are usually shorter, weaker, farther apart, and less regular than true labor contractions. True labor contractions usually become progressively stronger and regular and they become more frequent. °· Manage discomfort from Braxton Hicks contractions by changing position, resting in a warm bath, drinking plenty of water, or practicing deep breathing. °This information is not intended to replace advice given to you by your health care provider. Make sure you discuss any questions you have with your health care provider. °Document Released: 05/08/2005 Document Revised: 03/27/2016 Document Reviewed: 03/27/2016 °Elsevier Interactive Patient Education © 2017 Elsevier Inc. ° °

## 2017-03-30 NOTE — MAU Note (Signed)
Pt reports that she had some leaking fluid and also had some bleeding. Brownish at first but then turned to bright red and then to darker red/brown. I collected a swab to look for ferning and did see blood on the swab. No ferns seen on slide.

## 2017-03-30 NOTE — MAU Provider Note (Signed)
S: [name] is a 31 y.o. [id number] at 1933w2d  who presents to MAU today complaining of leaking of fluid since 1245. Reports trickle of fluid just prior to going to the restroom; states leaking has not continued. She endorses vaginal bleeding. Describes as dark red mucous on toilet paper this afternoon. Bleeding has not continued. She endorses contractions since this morning. She reports normal fetal movement.    O: BP 109/80 (BP Location: Right Arm)   Pulse 92   Temp 97.9 F (36.6 C) (Oral)   Resp 16   Ht 5\' 5"  (1.651 m)   Wt 175 lb (79.4 kg)   SpO2 98%   BMI 29.12 kg/m  GENERAL: Well-developed, well-nourished female in no acute distress.  HEAD: Normocephalic, atraumatic.  CHEST: Normal effort of breathing, regular heart rate ABDOMEN: Soft, nontender, gravid PELVIC: Normal external female genitalia. Vagina is pink and rugated. Cervix with normal contour, no lesions. Normal discharge.  No pooling. Small amount of dark red mucoid bloody show.    Cervical exam:  Dilation: 1.5 Effacement (%): 50 Cervical Position: Posterior Station: -3 Presentation: Vertex Exam by:: E Hibah Odonnell    Fetal Monitoring: Baseline: 145 Variability: moderate Accelerations: 15x15 Decelerations: none Contractions: Q2-5 mins, 30-80sec  Results for orders placed or performed during the hospital encounter of 03/30/17 (from the past 24 hour(s))  Fern Test     Status: None   Collection Time: 03/30/17  6:08 PM  Result Value Ref Range   POCT Fern Test     [place] Mfm Fetal Bpp Wo Non Stress  Result Date: 03/30/2017 ----------------------------------------------------------------------  OBSTETRICS REPORT                      (Signed Final 03/30/2017 05:32 pm) ---------------------------------------------------------------------- Patient Info  ID #:       [id number]                          D.O.B.:  04-20-1986 (31 yrs)  Name:       Amanda [name]                Visit Date: 03/30/2017 04:53 pm  ---------------------------------------------------------------------- Performed By  Performed By:     Percell BostonHeather Waken          Ref. Address:     [facility]                    [facility]                                                             Obstetrics &                                                             Gynecology  [address]                                                             [address]                                                             [place], [place]                                                             [id number]  Attending:        Ledon Snare MD         Secondary Phy.:   MAU Nursing-                                                             MAU/Triage  Referred By:      Jaymes Graff          Location:         [facility]                    MD ---------------------------------------------------------------------- Orders   #  Description                                 Code   1  [place] MFM FETAL BPP WO NON STRESS              76819.01  ----------------------------------------------------------------------   #  Ordered By               Order #        Accession #    Episode #   1  Judeth Horn            [id number]      [id number]     [id number]  ---------------------------------------------------------------------- Indications   [redacted] weeks gestation of pregnancy                Z3A.39   Vaginal bleeding in pregnancy, third trimester O46.93   Premature rupture of membranes - leaking       O42.90   fluid  ---------------------------------------------------------------------- OB History  Gravidity:    4         Term:   2        Prem:  0        SAB:   1  TOP:          0       Ectopic:  0        Living: 2 ---------------------------------------------------------------------- Fetal Evaluation  Num Of Fetuses:     1  Fetal Heart         134  Rate(bpm):  Cardiac  Activity:   Observed  Presentation:       Cephalic  Amniotic Fluid  AFI FV:      Subjectively within normal limits  AFI Sum(cm)     %Tile       Largest Pocket(cm)  15.84           65          5.67  RUQ(cm)       RLQ(cm)       LUQ(cm)        LLQ(cm)  3.23          5.67          4.71           2.23 ---------------------------------------------------------------------- Biophysical Evaluation  Amniotic F.V:   Within normal limits       F. Tone:        Observed  F. Movement:    Observed                   Score:          8/8  F. Breathing:   Observed ---------------------------------------------------------------------- Gestational Age  Clinical EDD:  39w 2d                                        EDD:   04/04/17  Best:          39w 2d     Det. By:  Clinical EDD             EDD:   04/04/17 ---------------------------------------------------------------------- Impression  Singleton intrauterine pregnancy at 39 weeks 2 days  gestation with fetal cardiac activity  Cephalic presentation  BPP 8/8 with an AFI > 15 cm ---------------------------------------------------------------------- Recommendations  Follow-up ultrasounds as clinically indicated. ----------------------------------------------------------------------                   Ledon SnareBrian Brost, MD Electronically Signed Final Report   03/30/2017 05:32 pm ----------------------------------------------------------------------   Reactive fetal tracing On exam no pooling, small amount of bloody show. Fern slide equivocal. C/w Dr. Normand Sloopillard. Will get BPP with AFI; if AFI normal patient to ambulate with pad on & check for continued leaking. Ok to discharge home with normal BPP/AFI & no further leaking.  BPP 8/8 with AFI of 15 Patient ambulated for >30 minutes. Reports no leaking & requesting to be discharged home.    A: [facility] at 5869w2d  Membranes intact  P: Discharge home F/u with OB as scheduled Discussed reasons to return to MAU  Judeth HornLawrence, Stephenia Vogan, NP 03/30/2017 3:31  PM

## 2017-03-30 NOTE — MAU Note (Signed)
Pt presents with complaint of contractions since this am, ? Leaking fluid since 1245,

## 2017-04-01 ENCOUNTER — Inpatient Hospital Stay (HOSPITAL_COMMUNITY): Payer: Federal, State, Local not specified - PPO | Admitting: Anesthesiology

## 2017-04-01 ENCOUNTER — Encounter (HOSPITAL_COMMUNITY): Payer: Self-pay

## 2017-04-01 ENCOUNTER — Inpatient Hospital Stay (HOSPITAL_COMMUNITY)
Admission: AD | Admit: 2017-04-01 | Discharge: 2017-04-02 | DRG: 805 | Disposition: A | Discharge status: Home / Self Care | Payer: Federal, State, Local not specified - PPO | Source: Ambulatory Visit | Attending: Obstetrics and Gynecology | Admitting: Obstetrics and Gynecology

## 2017-04-01 ENCOUNTER — Other Ambulatory Visit: Payer: Self-pay

## 2017-04-01 DIAGNOSIS — O9942 Diseases of the circulatory system complicating childbirth: Secondary | ICD-10-CM | POA: Diagnosis present

## 2017-04-01 DIAGNOSIS — Z3A39 39 weeks gestation of pregnancy: Secondary | ICD-10-CM

## 2017-04-01 DIAGNOSIS — I341 Nonrheumatic mitral (valve) prolapse: Secondary | ICD-10-CM | POA: Diagnosis present

## 2017-04-01 DIAGNOSIS — O4290 Premature rupture of membranes, unspecified as to length of time between rupture and onset of labor, unspecified weeks of gestation: Secondary | ICD-10-CM | POA: Diagnosis present

## 2017-04-01 DIAGNOSIS — Z3483 Encounter for supervision of other normal pregnancy, third trimester: Secondary | ICD-10-CM | POA: Diagnosis present

## 2017-04-01 DIAGNOSIS — Z3490 Encounter for supervision of normal pregnancy, unspecified, unspecified trimester: Secondary | ICD-10-CM

## 2017-04-01 HISTORY — DX: Premature rupture of membranes, unspecified as to length of time between rupture and onset of labor, unspecified weeks of gestation: O42.90

## 2017-04-01 LAB — CBC
HEMATOCRIT: 37.8 % (ref 36.0–46.0)
Hemoglobin: 13.1 g/dL (ref 12.0–15.0)
MCH: 31.3 pg (ref 26.0–34.0)
MCHC: 34.7 g/dL (ref 30.0–36.0)
MCV: 90.4 fL (ref 78.0–100.0)
PLATELETS: 255 10*3/uL (ref 150–400)
RBC: 4.18 MIL/uL (ref 3.87–5.11)
RDW: 13.6 % (ref 11.5–15.5)
WBC: 13 10*3/uL — ABNORMAL HIGH (ref 4.0–10.5)

## 2017-04-01 LAB — ABO/RH: ABO/RH(D): A POS

## 2017-04-01 LAB — TYPE AND SCREEN
ABO/RH(D): A POS
Antibody Screen: NEGATIVE

## 2017-04-01 LAB — POCT FERN TEST: POCT Fern Test: POSITIVE

## 2017-04-01 MED ORDER — OXYCODONE-ACETAMINOPHEN 5-325 MG PO TABS: 1.0000 | ORAL_TABLET | ORAL | Status: DC | PRN

## 2017-04-01 MED ORDER — ACETAMINOPHEN 325 MG PO TABS: 650.0000 mg | ORAL_TABLET | ORAL | Status: DC | PRN

## 2017-04-01 MED ORDER — OXYCODONE-ACETAMINOPHEN 5-325 MG PO TABS: 2.0000 | ORAL_TABLET | ORAL | Status: DC | PRN

## 2017-04-01 MED ORDER — LIDOCAINE HCL (PF) 1 % IJ SOLN: 30.0000 mL | INTRAMUSCULAR | Status: DC | PRN

## 2017-04-01 MED ORDER — LIDOCAINE HCL (PF) 1 % IJ SOLN
30.0000 mL | INTRAMUSCULAR | Status: DC | PRN
  Administered 2017-04-01: 30 mL via SUBCUTANEOUS
  Filled 2017-04-01: qty 30

## 2017-04-01 MED ORDER — OXYTOCIN 40 UNITS IN LACTATED RINGERS INFUSION - SIMPLE MED
2.5000 [IU]/h | INTRAVENOUS | Status: DC
  Filled 2017-04-01: qty 1000

## 2017-04-01 MED ORDER — FLEET ENEMA 7-19 GM/118ML RE ENEM: 1.0000 | ENEMA | RECTAL | Status: DC | PRN

## 2017-04-01 MED ORDER — LIDOCAINE HCL (PF) 1 % IJ SOLN
INTRAMUSCULAR | Status: DC | PRN
  Administered 2017-04-01 (×2): 5 mL via EPIDURAL

## 2017-04-01 MED ORDER — FENTANYL 2.5 MCG/ML BUPIVACAINE 1/10 % EPIDURAL INFUSION (WH - ANES)
14.0000 mL/h | INTRAMUSCULAR | Status: DC | PRN
  Administered 2017-04-01: 14 mL/h via EPIDURAL
  Filled 2017-04-01: qty 100

## 2017-04-01 MED ORDER — EPHEDRINE 5 MG/ML INJ
10.0000 mg | INTRAVENOUS | Status: DC | PRN
  Filled 2017-04-01: qty 2

## 2017-04-01 MED ORDER — LACTATED RINGERS IV SOLN
INTRAVENOUS | Status: DC
  Administered 2017-04-01: 20:00:00 via INTRAVENOUS

## 2017-04-01 MED ORDER — SOD CITRATE-CITRIC ACID 500-334 MG/5ML PO SOLN: 30.0000 mL | ORAL | Status: DC | PRN

## 2017-04-01 MED ORDER — OXYTOCIN BOLUS FROM INFUSION
500.0000 mL | Freq: Once | INTRAVENOUS | Status: AC
  Administered 2017-04-01: 500 mL via INTRAVENOUS

## 2017-04-01 MED ORDER — PHENYLEPHRINE 40 MCG/ML (10ML) SYRINGE FOR IV PUSH (FOR BLOOD PRESSURE SUPPORT)
80.0000 ug | PREFILLED_SYRINGE | INTRAVENOUS | Status: AC | PRN
  Administered 2017-04-01 (×3): 80 ug via INTRAVENOUS

## 2017-04-01 MED ORDER — OXYTOCIN BOLUS FROM INFUSION: 500.0000 mL | Freq: Once | INTRAVENOUS | Status: DC

## 2017-04-01 MED ORDER — OXYTOCIN 40 UNITS IN LACTATED RINGERS INFUSION - SIMPLE MED: 2.5000 [IU]/h | INTRAVENOUS | Status: DC

## 2017-04-01 MED ORDER — LACTATED RINGERS IV SOLN: 500.0000 mL | INTRAVENOUS | Status: DC | PRN

## 2017-04-01 MED ORDER — PHENYLEPHRINE 40 MCG/ML (10ML) SYRINGE FOR IV PUSH (FOR BLOOD PRESSURE SUPPORT)
80.0000 ug | PREFILLED_SYRINGE | INTRAVENOUS | Status: DC | PRN
  Filled 2017-04-01: qty 10
  Filled 2017-04-01: qty 5

## 2017-04-01 MED ORDER — LACTATED RINGERS IV SOLN: INTRAVENOUS | Status: DC

## 2017-04-01 MED ORDER — LACTATED RINGERS IV SOLN
500.0000 mL | INTRAVENOUS | Status: DC | PRN
  Administered 2017-04-01: 500 mL via INTRAVENOUS

## 2017-04-01 MED ORDER — LACTATED RINGERS IV SOLN: 500.0000 mL | Freq: Once | INTRAVENOUS | Status: DC

## 2017-04-01 MED ORDER — ONDANSETRON HCL 4 MG/2ML IJ SOLN: 4.0000 mg | Freq: Four times a day (QID) | INTRAMUSCULAR | Status: DC | PRN

## 2017-04-01 MED ORDER — DIPHENHYDRAMINE HCL 50 MG/ML IJ SOLN: 12.5000 mg | INTRAMUSCULAR | Status: DC | PRN

## 2017-04-01 NOTE — H&P (Signed)
Amanda [name] is [name] 31 y.o. female presenting for SROM and labor at 1500.  Pt had clear fluid and contractions are 2-4 minutes OB History    Gravida Para Term Preterm AB Living   4 2 2  0 1 2   SAB TAB Ectopic Multiple Live Births   1 0 0 0 2     Past Medical History:  Diagnosis Date  . Abnormal Pap smear   . Endometriosis   . Heart murmur   . Miscarriage   . MVP (mitral valve prolapse)    Past Surgical History:  Procedure Laterality Date  . CERVICAL BIOPSY  W/ LOOP ELECTRODE EXCISION  2004  . WISDOM TOOTH EXTRACTION     Family History: family history includes Asthma in her sister; Breast cancer in her maternal grandmother; Cancer in her maternal uncle; Diabetes in her paternal grandfather; Heart disease in her father and paternal grandfather; Hyperlipidemia in her maternal grandfather and maternal grandmother; Hypertension in her maternal grandfather, mother, and paternal grandfather; Obesity in her mother and sister. Social History:  reports that  has never smoked. she has never used smokeless tobacco. She reports that she does not drink alcohol or use drugs.     Maternal Diabetes: No Genetic Screening: Declined Maternal Ultrasounds/Referrals: Normal Fetal Ultrasounds or other Referrals:  None Maternal Substance Abuse:  No Significant Maternal Medications:  None Significant Maternal Lab Results:  None Other Comments:  None  ROS History Dilation: 4.5 Effacement (%): 50 Station: -2 Exam by:: Amanda ArcherJ McClellan, RN unknown if currently breastfeeding. Exam Physical Exam  Physical Examination: General appearance - alert, well appearing, and in no distress Chest - clear to auscultation, no wheezes, rales or rhonchi, symmetric air entry Heart - normal rate and regular rhythm Abdomen - soft, nontender, nondistended, no masses or organomegaly gravid Extremities - Homan's sign negative bilaterally Prenatal labs: ABO, Rh:   Antibody:   Rubella:   RPR:    HBsAg:    HIV:     GBS:     Assessment/Plan: SROM and active labor Pt had TDAP GBS neg Cat 1  Anticipate SVD   Amanda [name] 04/01/2017, 6:35 PM

## 2017-04-01 NOTE — Progress Notes (Addendum)
G4P2 @ 39.[redacted] wksga. Presents to triage for r/o labor. Possible ROM around 1530. Denies bleeding.   Fern ordered.   EFM applied  SVE: 4/50/-2  Crist FatFern +  1732: provider notified. Report status of pt given. Ordered to admit.  Birthing charge notified. Room assigned to 169  1745: Labs drawn and IV started.   1800: pt to birthing via wheelchair. Up to bathroom to void. Notified birthing of pt being in the bathroom   1810: monitors placed.

## 2017-04-01 NOTE — Anesthesia Preprocedure Evaluation (Signed)
Anesthesia Evaluation  Patient identified by MRN, date of birth, ID band Patient awake    Reviewed: Allergy & Precautions, H&P , NPO status , Patient's Chart, lab work & pertinent test results, reviewed documented beta blocker date and time   History of Anesthesia Complications Negative for: history of anesthetic complications  Airway Mallampati: II  TM Distance: >3 FB Neck ROM: full    Dental  (+) Teeth Intact, Dental Advisory Given   Pulmonary neg pulmonary ROS,    breath sounds clear to auscultation       Cardiovascular + Valvular Problems/Murmurs MVP  Rhythm:regular Rate:Normal     Neuro/Psych negative neurological ROS  negative psych ROS   GI/Hepatic negative GI ROS, Neg liver ROS,   Endo/Other  negative endocrine ROS  Renal/GU negative Renal ROS  negative genitourinary   Musculoskeletal   Abdominal   Peds  Hematology negative hematology ROS (+)   Anesthesia Other Findings   Reproductive/Obstetrics (+) Pregnancy                             Anesthesia Physical  Anesthesia Plan  ASA: II  Anesthesia Plan: Epidural   Post-op Pain Management:    Induction:   PONV Risk Score and Plan:   Airway Management Planned:   Additional Equipment:   Intra-op Plan:   Post-operative Plan:   Informed Consent: I have reviewed the patients History and Physical, chart, labs and discussed the procedure including the risks, benefits and alternatives for the proposed anesthesia with the patient or authorized representative who has indicated his/her understanding and acceptance.     Plan Discussed with: Anesthesiologist  Anesthesia Plan Comments:         Anesthesia Quick Evaluation

## 2017-04-01 NOTE — Progress Notes (Signed)
Amanda [name] MRN: [id number]  Subjective: -Care assumed of 31 y.o. Amanda [name] at 4193w4d who presents for active labor s/p SROM. Patient is under the care of [facility] and pregnancy history significant for Mitral valve prolapse with occasional pvcs. In room to meet acquaintance of patient and family.  Patient comfortable after epidural and denies rectal pressure.   Objective: BP 104/80   Pulse 92   Temp 97.8 F (36.6 C) (Oral)   Resp 18   SpO2 100%  No intake/output data recorded. No intake/output data recorded.  Fetal Monitoring: FHT: 150 bpm, Mod Var, -Decels, +Accels UC: Q2-83min, palpates moderate    Physical Exam: General appearance: alert, well appearing, and in no distress. Chest: normal rate and regular rhythm.   Abdominal exam: Soft RT, palpates mild. Extremities: Non pitting edema Skin exam: Warm Dry  Vaginal Exam: SVE:   Dilation: 6 Effacement (%): 90 Station: -1 Exam by:: Gerrit HeckJessica Quintarius Ferns, CNM Membranes:AROM of Forebag-Clear Fluid Internal Monitors: None  Augmentation/Induction: Pitocin:None Cytotec: None  Assessment:  IUP at 39.4wks Cat I FT  Active Labor  Plan: -AROM of forebag -Expectant mgmt -Continue other mgmt as ordered   Valma CavaJessica L Ameirah Khatoon,MSN, CNM 04/01/2017, 9:10 PM   Addendum FHR with prolonged deceleration s/p patient hypotension Ephedrine given x 2 with good results and return of FHR to baseline. Will continue to monitor  Cherre RobinsJessica L Ander Wamser MSN, CNM

## 2017-04-01 NOTE — Anesthesia Procedure Notes (Signed)
Epidural Patient location during procedure: OB Start time: 04/01/2017 8:16 PM End time: 04/01/2017 8:28 PM  Staffing Anesthesiologist: Heather RobertsSinger, Keishia Ground, MD Performed: anesthesiologist   Preanesthetic Checklist Completed: patient identified, site marked, pre-op evaluation, timeout performed, IV checked, risks and benefits discussed and monitors and equipment checked  Epidural Patient position: sitting Prep: DuraPrep Patient monitoring: heart rate, cardiac monitor, continuous pulse ox and blood pressure Approach: midline Location: L2-L3 Injection technique: LOR saline  Needle:  Needle type: Tuohy  Needle gauge: 17 G Needle length: 9 cm Needle insertion depth: 4 cm Catheter size: 20 Guage Catheter at skin depth: 9 cm Test dose: negative and Other  Assessment Events: blood not aspirated, injection not painful, no injection resistance and negative IV test  Additional Notes Informed consent obtained prior to proceeding including risk of failure, 1% risk of PDPH, risk of minor discomfort and bruising.  Discussed rare but serious complications including epidural abscess, permanent nerve injury, epidural hematoma.  Discussed alternatives to epidural analgesia and patient desires to proceed.  Timeout performed pre-procedure verifying patient name, procedure, and platelet count.  Patient tolerated procedure well.

## 2017-04-02 LAB — CBC
HEMATOCRIT: 34.4 % — AB (ref 36.0–46.0)
HEMOGLOBIN: 11.8 g/dL — AB (ref 12.0–15.0)
MCH: 31.4 pg (ref 26.0–34.0)
MCHC: 34.3 g/dL (ref 30.0–36.0)
MCV: 91.5 fL (ref 78.0–100.0)
Platelets: 240 10*3/uL (ref 150–400)
RBC: 3.76 MIL/uL — AB (ref 3.87–5.11)
RDW: 13.6 % (ref 11.5–15.5)
WBC: 15.3 10*3/uL — ABNORMAL HIGH (ref 4.0–10.5)

## 2017-04-02 LAB — RPR: RPR: NONREACTIVE

## 2017-04-02 MED ORDER — ACETAMINOPHEN 325 MG PO TABS
650.0000 mg | ORAL_TABLET | ORAL | Status: DC | PRN
  Administered 2017-04-02 (×3): 650 mg via ORAL
  Filled 2017-04-02 (×3): qty 2

## 2017-04-02 MED ORDER — SENNOSIDES-DOCUSATE SODIUM 8.6-50 MG PO TABS: 2.0000 | ORAL_TABLET | ORAL | Status: DC

## 2017-04-02 MED ORDER — DIPHENHYDRAMINE HCL 25 MG PO CAPS: 25.0000 mg | ORAL_CAPSULE | Freq: Four times a day (QID) | ORAL | Status: DC | PRN

## 2017-04-02 MED ORDER — ONDANSETRON HCL 4 MG/2ML IJ SOLN: 4.0000 mg | INTRAMUSCULAR | Status: DC | PRN

## 2017-04-02 MED ORDER — DIBUCAINE 1 % RE OINT: 1.0000 "application " | TOPICAL_OINTMENT | RECTAL | Status: DC | PRN

## 2017-04-02 MED ORDER — SIMETHICONE 80 MG PO CHEW: 80.0000 mg | CHEWABLE_TABLET | ORAL | Status: DC | PRN

## 2017-04-02 MED ORDER — TETANUS-DIPHTH-ACELL PERTUSSIS 5-2.5-18.5 LF-MCG/0.5 IM SUSP: 0.5000 mL | Freq: Once | INTRAMUSCULAR | Status: DC

## 2017-04-02 MED ORDER — IBUPROFEN 600 MG PO TABS
600.0000 mg | ORAL_TABLET | Freq: Four times a day (QID) | ORAL | Status: DC
  Administered 2017-04-02 (×3): 600 mg via ORAL
  Filled 2017-04-02 (×3): qty 1

## 2017-04-02 MED ORDER — ONDANSETRON HCL 4 MG PO TABS: 4.0000 mg | ORAL_TABLET | ORAL | Status: DC | PRN

## 2017-04-02 MED ORDER — BENZOCAINE-MENTHOL 20-0.5 % EX AERO
1.0000 "application " | INHALATION_SPRAY | CUTANEOUS | Status: DC | PRN
  Filled 2017-04-02: qty 56

## 2017-04-02 MED ORDER — PRENATAL MULTIVITAMIN CH
1.0000 | ORAL_TABLET | Freq: Every day | ORAL | Status: DC
  Administered 2017-04-02: 1 via ORAL
  Filled 2017-04-02: qty 1

## 2017-04-02 MED ORDER — ZOLPIDEM TARTRATE 5 MG PO TABS: 5.0000 mg | ORAL_TABLET | Freq: Every evening | ORAL | Status: DC | PRN

## 2017-04-02 MED ORDER — WITCH HAZEL-GLYCERIN EX PADS: 1.0000 "application " | MEDICATED_PAD | CUTANEOUS | Status: DC | PRN

## 2017-04-02 MED ORDER — COCONUT OIL OIL: 1.0000 "application " | TOPICAL_OIL | Status: DC | PRN

## 2017-04-02 NOTE — Anesthesia Postprocedure Evaluation (Signed)
Anesthesia Post Note  Patient: Amanda [name]  Procedure(s) Performed: AN AD HOC LABOR EPIDURAL     Patient location during evaluation: Mother Baby Anesthesia Type: Epidural Level of consciousness: awake Pain management: satisfactory to patient Vital Signs Assessment: post-procedure vital signs reviewed and stable Respiratory status: spontaneous breathing Cardiovascular status: stable Anesthetic complications: no    Last Vitals:  Vitals:   04/02/17 0100 04/02/17 0500  BP: 110/71 104/69  Pulse: 94 90  Resp: 16 16  Temp: 36.7 C   SpO2:      Last Pain:  Vitals:   04/02/17 0504  TempSrc:   PainSc: 4    Pain Goal:                 KeyCorpBURGER,Jojuan Champney

## 2017-04-02 NOTE — Discharge Instructions (Signed)

## 2017-04-02 NOTE — Progress Notes (Signed)
Discharge teaching complete. Pt educated about follow up symptoms to report. Pt states she understands and has no further questions.

## 2017-04-02 NOTE — Discharge Summary (Signed)
Obstetric Discharge Summary Reason for Admission: onset of labor and rupture of membranes Prenatal Procedures: us Intrapartum Procedures: spontaneous vaginal delivery Postpartum Procedures: none Complications-Operative and Postpartum: none Hemoglobin  Date Value Ref Range Status  04/02/2017 11.8 (L) 12.0 - 15.0 g/dL Final  21/30/865703/28/2013 84.615.3 g/dL    HCT  Date Value Ref Range Status  04/02/2017 34.4 (L) 36.0 - 46.0 % Final  08/17/2011 47 %     Physical Exam:  General: alert, cooperative and no distress Lochia: appropriate Uterine Fundus: firm Incision: healing well, n/a DVT Evaluation: No evidence of DVT seen on physical exam. Negative Homan's sign.  Discharge Diagnoses: Term Pregnancy-delivered  Discharge Information: Date: 04/02/2017 Activity: unrestricted Diet: routine Medications: PNV and Ibuprofen Condition: stable Instructions: refer to practice specific booklet Discharge to: home Follow-up Information    [facility] Follow up in 6 week(s).   Specialty:  Obstetrics and Gynecology Contact information: [address] [place] [phone]          Newborn Data: Live born female  Birth Weight: 7 lb 13.4 oz (3555 g) APGAR: 9, 9  Newborn Delivery   Birth date/time:  04/01/2017 21:48:00 Delivery type:  Vaginal, Spontaneous     Home with mother.  Amanda [name] 04/02/2017, 9:49 PM

## 2017-04-02 NOTE — Progress Notes (Signed)
Amanda LeeSabrina [name]  Post Partum Day 1:S/P SVD with   Subjective: Patient up ad lib, denies syncope or dizziness. Reports consuming regular diet without issues and denies N/V. Denies issues with urination and reports bleeding is "good."  Patient is breastfeeding and reports going well.  Desires condoms for postpartum contraception.  Pain is being appropriately managed with use of motrin.  Objective: Vitals:   04/01/17 2331 04/02/17 0000 04/02/17 0100 04/02/17 0500  BP: 122/73 116/80 110/71 104/69  Pulse: 97 90 94 90  Resp:  16 16 16   Temp:  98.1 F (36.7 C) 98.1 F (36.7 C)   TempSrc:  Oral Oral   SpO2:  98%     Recent Labs    04/01/17 1745 04/02/17 0531  HGB 13.1 11.8*  HCT 37.8 34.4*    Physical Exam:  General: alert, cooperative and no distress Mood/Affect: Appropriate/appropriate Lungs: clear to auscultation, no wheezes, rales or rhonchi, symmetric air entry.  Heart: normal rate and regular rhythm. Breast: breasts appear normal, no suspicious masses, no skin or nipple changes or axillary nodes. Abdomen:  + bowel sounds, Soft, NT Uterine Fundus: firm, U/-1 Lochia: appropriate Laceration: None Skin: Warm, Dry DVT Evaluation: Calf/Ankle edema is present.  Assessment S/P Vaginal Delivery-Day 1 Normal Involution BreastFeeding   Plan: Questions re discharge at 24 hours Instructed to contact nurse if baby okay for discharge after 24hr testing Education: Follow up care Bleeding expectations Continue current care Dr. Lynford HumphreySR to be updated on patient status   [name], MSN, CNM 04/02/2017, 6:31 AM

## 2017-04-02 NOTE — Lactation Note (Signed)
This note was copied from a baby'[name] chart. Lactation Consultation Note  Patient Name: Amanda Laurell RoofSabrina Canal RUEAV'WToday'[name] Date: 04/02/2017 Reason for consult: Initial assessment;Term Breastfeeding consultation services and support information given and reviewed.  This is mom'[name] third baby and newborn is 2419 hours old.  Mom reports baby is latching and feeding well.  Instructed to feed with any cue using good waking techniques and breast massage.  Mom denies questions. Encouraged to call with concerns/assist prn.  Maternal Data    Feeding Feeding Type: Breast Fed  LATCH Score Latch: Grasps breast easily, tongue down, lips flanged, rhythmical sucking.  Audible Swallowing: Spontaneous and intermittent  Type of Nipple: Everted at rest and after stimulation  Comfort (Breast/Nipple): Soft / non-tender  Hold (Positioning): No assistance needed to correctly position infant at breast.  LATCH Score: 10  Interventions    Lactation Tools Discussed/Used     Consult Status Consult Status: Follow-up Date: 04/03/17 Follow-up type: In-patient    Amanda [name], Amanda [name] 04/02/2017, 4:49 PM

## 2019-08-15 DIAGNOSIS — Z6826 Body mass index (BMI) 26.0-26.9, adult: Secondary | ICD-10-CM | POA: Diagnosis not present

## 2019-08-15 DIAGNOSIS — R5383 Other fatigue: Secondary | ICD-10-CM | POA: Diagnosis not present

## 2019-08-15 DIAGNOSIS — Z01419 Encounter for gynecological examination (general) (routine) without abnormal findings: Secondary | ICD-10-CM | POA: Diagnosis not present

## 2019-08-16 DIAGNOSIS — E559 Vitamin D deficiency, unspecified: Secondary | ICD-10-CM | POA: Insufficient documentation

## 2019-10-16 DIAGNOSIS — E559 Vitamin D deficiency, unspecified: Secondary | ICD-10-CM | POA: Diagnosis not present

## 2020-01-08 ENCOUNTER — Other Ambulatory Visit: Payer: Self-pay

## 2020-01-08 ENCOUNTER — Ambulatory Visit
Admission: RE | Admit: 2020-01-08 | Discharge: 2020-01-08 | Disposition: A | Discharge status: Home / Self Care | Payer: BLUE CROSS/BLUE SHIELD | Source: Ambulatory Visit | Attending: Emergency Medicine | Admitting: Emergency Medicine

## 2020-01-08 VITALS — BP 115/76 | HR 79 | Temp 98.0°F | Resp 17

## 2020-01-08 DIAGNOSIS — Z1152 Encounter for screening for COVID-19: Secondary | ICD-10-CM

## 2020-01-08 DIAGNOSIS — R110 Nausea: Secondary | ICD-10-CM

## 2020-01-08 DIAGNOSIS — G44209 Tension-type headache, unspecified, not intractable: Secondary | ICD-10-CM

## 2020-01-08 MED ORDER — ONDANSETRON 4 MG PO TBDP: 4.0000 mg | ORAL_TABLET | Freq: Three times a day (TID) | ORAL | 0 refills | Status: DC | PRN

## 2020-01-08 MED ORDER — DEXAMETHASONE SODIUM PHOSPHATE 10 MG/ML IJ SOLN
10.0000 mg | Freq: Once | INTRAMUSCULAR | Status: AC
  Administered 2020-01-08: 10 mg via INTRAMUSCULAR

## 2020-01-08 MED ORDER — IBUPROFEN 800 MG PO TABS: 800.0000 mg | ORAL_TABLET | Freq: Three times a day (TID) | ORAL | 0 refills | Status: DC

## 2020-01-08 MED ORDER — KETOROLAC TROMETHAMINE 60 MG/2ML IM SOLN
60.0000 mg | Freq: Once | INTRAMUSCULAR | Status: AC
  Administered 2020-01-08: 60 mg via INTRAMUSCULAR

## 2020-01-08 MED ORDER — ONDANSETRON 4 MG PO TBDP
4.0000 mg | ORAL_TABLET | Freq: Once | ORAL | Status: AC
  Administered 2020-01-08: 4 mg via ORAL

## 2020-01-08 NOTE — ED Provider Notes (Signed)
[facility]   [id number] 01/08/20 Arrival Time: 1800  [id number]  SUBJECTIVE:  Amanda [name] is a 34 y.o. female who presented to the urgent care for complaint of headache with nausea for the past 2 days.  Patient localizes her pain to the left head.  Describes the pain as constant and throbbing in character.  Patient has tried OTC medication without relief.  Denies aggravating factor.  Denies similar symptoms in the past.  This is not the worst headache of their life.  Patient denies fever, chills, vomiting, aura, rhinorrhea, watery eyes, chest pain, SOB, abdominal pain, weakness, numbness or tingling, slurred speech.     ROS: As per HPI.  All other pertinent ROS negative.     Past Medical History:  Diagnosis Date  . Abnormal Pap smear   . Endometriosis   . Heart murmur   . Miscarriage   . MVP (mitral valve prolapse)    Past Surgical History:  Procedure Laterality Date  . CERVICAL BIOPSY  W/ LOOP ELECTRODE EXCISION  2004  . WISDOM TOOTH EXTRACTION     No Known Allergies No current facility-administered medications on file prior to encounter.   Current Outpatient Medications on File Prior to Encounter  Medication Sig Dispense Refill  . Prenatal Vit-Fe Fumarate-FA (PRENATAL MULTIVITAMIN) TABS Take 1 tablet by mouth daily.     Social History   Socioeconomic History  . Marital status: Married    Spouse name: [name]  . Number of children: 1  . Years of education: college  . Highest education level: Not on file  Occupational History    Employer: [employer]    Comment: [employer]  Tobacco Use  . Smoking status: Never Smoker  . Smokeless tobacco: Never Used  Substance and Sexual Activity  . Alcohol use: No  . Drug use: No  . Sexual activity: Yes    Birth control/protection: None  Other Topics Concern  . Not on file  Social History Narrative   Patient lives at home with her husband ([name]). Patient works [employer]. Patient has her associates  degree. Patient has one child.   Right handed.   Caffeine None    Social Determinants of Health   Financial Resource Strain:   . Difficulty of Paying Living Expenses: Not on file  Food Insecurity:   . Worried About Programme researcher, broadcasting/film/video in the Last Year: Not on file  . Ran Out of Food in the Last Year: Not on file  Transportation Needs:   . Lack of Transportation (Medical): Not on file  . Lack of Transportation (Non-Medical): Not on file  Physical Activity:   . Days of Exercise per Week: Not on file  . Minutes of Exercise per Session: Not on file  Stress:   . Feeling of Stress : Not on file  Social Connections:   . Frequency of Communication with Friends and Family: Not on file  . Frequency of Social Gatherings with Friends and Family: Not on file  . Attends Religious Services: Not on file  . Active Member of Clubs or Organizations: Not on file  . Attends Banker Meetings: Not on file  . Marital Status: Not on file  Intimate Partner Violence:   . Fear of Current or Ex-Partner: Not on file  . Emotionally Abused: Not on file  . Physically Abused: Not on file  . Sexually Abused: Not on file   Family History  Problem Relation Age of Onset  . Hypertension Mother  hyperlipidemia  . Obesity Mother   . Heart disease Father   . Asthma Sister   . Obesity Sister   . Cancer Maternal Uncle   . Breast cancer Maternal Grandmother   . Hyperlipidemia Maternal Grandmother   . Hypertension Maternal Grandfather   . Hyperlipidemia Maternal Grandfather   . Diabetes Paternal Grandfather   . Hypertension Paternal Grandfather   . Heart disease Paternal Grandfather        CABGx3    OBJECTIVE:  Vitals:   01/08/20 1842  BP: 115/76  Pulse: 79  Resp: 17  Temp: 98 F (36.7 C)  TempSrc: Oral  SpO2: 97%    General appearance: alert; no distress Eyes: PERRLA; EOMI HENT: normocephalic; atraumatic Neck: supple with FROM Lungs: clear to auscultation bilaterally Heart:  regular rate and rhythm.  Radial pulses 2+ symmetrical bilaterally Extremities: no edema; symmetrical with no gross deformities Skin: warm and dry Neurologic: CN 2-12 grossly intact; finger to nose without difficulty; normal gait; strength and sensation intact bilaterally about the upper and lower extremities; negative pronator drift Psychological: alert and cooperative; normal mood and affect   ASSESSMENT & PLAN:  1. Acute non intractable tension-type headache   2. Nausea without vomiting   3. Encounter for screening for COVID-19     Meds ordered this encounter  Medications  . ibuprofen (ADVIL) 800 MG tablet    Sig: Take 1 tablet (800 mg total) by mouth 3 (three) times daily.    Dispense:  30 tablet    Refill:  0  . ondansetron (ZOFRAN-ODT) 4 MG disintegrating tablet    Sig: Take 1 tablet (4 mg total) by mouth every 8 (eight) hours as needed for nausea or vomiting.    Dispense:  20 tablet    Refill:  0   Discharge instructions Migraine cocktail given in office Zofran ODT prescribed for nausea Ibuprofen for prescribed for headache Rest and drink plenty of fluids Follow up with PCP if symptoms persists Return or go to the ER if you have any new or worsening symptoms such as fever, chills, nausea, vomiting, chest pain, shortness of breath, cough, vision changes, worsening headache despite treatment, slurred speech, facial asymmetry, weakness in arms or legs, etc...  Reviewed expectations re: course of current medical issues. Questions answered. Outlined signs and symptoms indicating need for more acute intervention. Patient verbalized understanding. After Visit Summary given.   Note: This document was prepared using Dragon voice recognition software and may include unintentional dictation errors.    Durward Parcel, FNP 01/08/20 1918

## 2020-01-08 NOTE — ED Triage Notes (Signed)
headache x 2 days no hx of miagraine left sided making her nauseated. otc meds not working, was stung by a bee 1 week on left ankle swollen  has hives

## 2020-01-08 NOTE — Discharge Instructions (Addendum)
Migraine cocktail given in office Zofran ODT prescribed for nausea Ibuprofen for prescribed for headache Rest and drink plenty of fluids Follow up with PCP if symptoms persists Return or go to the ER if you have any new or worsening symptoms such as fever, chills, nausea, vomiting, chest pain, shortness of breath, cough, vision changes, worsening headache despite treatment, slurred speech, facial asymmetry, weakness in arms or legs, etc..Marland Kitchen

## 2020-01-09 LAB — SARS-COV-2, NAA 2 DAY TAT

## 2020-01-09 LAB — NOVEL CORONAVIRUS, NAA: SARS-CoV-2, NAA: NOT DETECTED

## 2020-01-12 ENCOUNTER — Ambulatory Visit
Admission: EM | Admit: 2020-01-12 | Discharge: 2020-01-12 | Disposition: A | Discharge status: Home / Self Care | Payer: BLUE CROSS/BLUE SHIELD

## 2020-01-12 DIAGNOSIS — R6883 Chills (without fever): Secondary | ICD-10-CM | POA: Diagnosis not present

## 2020-01-12 DIAGNOSIS — B349 Viral infection, unspecified: Secondary | ICD-10-CM | POA: Diagnosis not present

## 2020-01-12 NOTE — Discharge Instructions (Addendum)
Take Tylenol or ibuprofen as needed for fever or discomfort.    Rest and stay hydrated.  Take the Zofran as needed for nausea.    Follow up with your primary care provider if your symptoms are not improving.

## 2020-01-12 NOTE — ED Provider Notes (Signed)
Amanda [name]    CSN: [id number] Arrival date & time: 01/12/20  4098      History   Chief Complaint Chief Complaint  Patient presents with  . Headache  . Chills  . Nausea    HPI Amanda [name] is a 34 y.o. female.   Patient presents with headache, nausea, fatigue, chills x6 days.  She denies fever, sore throat, cough, shortness of breath, abdominal pain, vomiting, diarrhea, or other symptoms.  She was seen at [facility] urgent care on 01/08/2020; diagnosed with acute non-intractable headache; treated with Toradol, Decadron, Zofran; sent home with prescriptions for Zofran and ibuprofen; COVID negative.    The history is provided by the patient.    Past Medical History:  Diagnosis Date  . Abnormal Pap smear   . Endometriosis   . Heart murmur   . Miscarriage   . MVP (mitral valve prolapse)     Patient Active Problem List   Diagnosis Date Noted  . Pregnancy 04/01/2017  . Delayed delivery after SROM (spontaneous rupture of membranes) 04/01/2017  . First degree perineal laceration during delivery 10/24/2013  . SVD (spontaneous vaginal delivery) 10/24/2013  . MVP (mitral valve prolapse)--hx, but negative cardiac w/u during pregancy 04/09/2012  . S/P LEEP 04/09/2012    Past Surgical History:  Procedure Laterality Date  . CERVICAL BIOPSY  W/ LOOP ELECTRODE EXCISION  2004  . WISDOM TOOTH EXTRACTION      OB History    Gravida  4   Para  3   Term  3   Preterm  0   AB  1   Living  3     SAB  1   TAB  0   Ectopic  0   Multiple  0   Live Births  3            Home Medications    Prior to Admission medications   Medication Sig Start Date End Date Taking? Authorizing Provider  MAGNESIUM-CALCIUM-FOLIC ACID PO Take by mouth.   Yes [provider]  VITAMIN D PO Take by mouth.   Yes [provider]  ibuprofen (ADVIL) 800 MG tablet Take 1 tablet (800 mg total) by mouth 3 (three) times daily. 01/08/20   Avegno, Zachery Dakins, FNP    ondansetron (ZOFRAN-ODT) 4 MG disintegrating tablet Take 1 tablet (4 mg total) by mouth every 8 (eight) hours as needed for nausea or vomiting. 01/08/20   Avegno, Zachery Dakins, FNP  Prenatal Vit-Fe Fumarate-FA (PRENATAL MULTIVITAMIN) TABS Take 1 tablet by mouth daily.    [provider]    Family History Family History  Problem Relation Age of Onset  . Hypertension Mother        hyperlipidemia  . Obesity Mother   . Heart disease Father   . Asthma Sister   . Obesity Sister   . Cancer Maternal Uncle   . Breast cancer Maternal Grandmother   . Hyperlipidemia Maternal Grandmother   . Hypertension Maternal Grandfather   . Hyperlipidemia Maternal Grandfather   . Diabetes Paternal Grandfather   . Hypertension Paternal Grandfather   . Heart disease Paternal Grandfather        CABGx3    Social History Social History   Tobacco Use  . Smoking status: Never Smoker  . Smokeless tobacco: Never Used  Substance Use Topics  . Alcohol use: No  . Drug use: No     Allergies   Patient has no known allergies.   Review of Systems Review  of Systems  Constitutional: Positive for chills and fatigue. Negative for fever.  HENT: Negative for ear pain and sore throat.   Eyes: Negative for pain and visual disturbance.  Respiratory: Negative for cough and shortness of breath.   Cardiovascular: Negative for chest pain and palpitations.  Gastrointestinal: Positive for nausea. Negative for abdominal pain, diarrhea and vomiting.  Genitourinary: Negative for dysuria and hematuria.  Musculoskeletal: Negative for arthralgias and back pain.  Skin: Negative for color change and rash.  Neurological: Positive for headaches. Negative for dizziness, seizures, syncope, facial asymmetry, speech difficulty, weakness and numbness.  All other systems reviewed and are negative.    Physical Exam Triage Vital Signs ED Triage Vitals [01/12/20 0956]  Enc Vitals Group     BP      Pulse      Resp       Temp      Temp src      SpO2      Weight      Height      Head Circumference      Peak Flow      Pain Score 7     Pain Loc      Pain Edu?      Excl. in GC?    No data found.  Updated Vital Signs BP 114/79 (BP Location: Left Arm)   Pulse (!) 58   Temp 98.1 F (36.7 C) (Oral)   Resp 16   LMP  (Within Weeks) Comment: 1 week  SpO2 98%   Visual Acuity Right Eye Distance:   Left Eye Distance:   Bilateral Distance:    Right Eye Near:   Left Eye Near:    Bilateral Near:     Physical Exam Vitals and nursing note reviewed.  Constitutional:      General: She is not in acute distress.    Appearance: She is well-developed. She is not ill-appearing.  HENT:     Head: Normocephalic and atraumatic.     Right Ear: Tympanic membrane normal.     Left Ear: Tympanic membrane normal.     Nose: Nose normal.     Mouth/Throat:     Mouth: Mucous membranes are moist.     Pharynx: Oropharynx is clear.  Eyes:     Extraocular Movements: Extraocular movements intact.     Conjunctiva/sclera: Conjunctivae normal.     Pupils: Pupils are equal, round, and reactive to light.  Cardiovascular:     Rate and Rhythm: Normal rate and regular rhythm.     Heart sounds: Normal heart sounds. No murmur heard.   Pulmonary:     Effort: Pulmonary effort is normal. No respiratory distress.     Breath sounds: Normal breath sounds.  Abdominal:     Palpations: Abdomen is soft.     Tenderness: There is no abdominal tenderness. There is no guarding or rebound.  Musculoskeletal:     Cervical back: Neck supple.     Right lower leg: No edema.     Left lower leg: No edema.  Skin:    General: Skin is warm and dry.     Findings: No rash.  Neurological:     General: No focal deficit present.     Mental Status: She is alert and oriented to person, place, and time.     Cranial Nerves: No cranial nerve deficit.     Sensory: No sensory deficit.     Motor: No weakness.     Coordination: Romberg sign negative.  Coordination normal.     Gait: Gait normal.  Psychiatric:        Mood and Affect: Mood normal.        Behavior: Behavior normal.      UC Treatments / Results  Labs (all labs ordered are listed, but only abnormal results are displayed) Labs Reviewed  NOVEL CORONAVIRUS, NAA    EKG   Radiology No results found.  Procedures Procedures (including critical care time)  Medications Ordered in UC Medications - No data to display  Initial Impression / Assessment and Plan / UC Course  I have reviewed the triage vital signs and the nursing notes.  Pertinent labs & imaging results that were available during my care of the patient were reviewed by me and considered in my medical decision making (see chart for details).   Viral illness.  Patient is well-appearing and her exam is reassuring.  Instructed her to continue Tylenol or ibuprofen as needed for fever or discomfort and Zofran as needed for nausea.  Instructed her to rest and stay hydrated.  Discussed that she should follow-up with her PCP this week.  Patient agrees to plan of care.   Final Clinical Impressions(s) / UC Diagnoses   Final diagnoses:  Viral illness     Discharge Instructions     Take Tylenol or ibuprofen as needed for fever or discomfort.    Rest and stay hydrated.  Take the Zofran as needed for nausea.    Follow up with your primary care provider if your symptoms are not improving.       ED Prescriptions    None     I have reviewed the PDMP during this encounter.   Mickie Bail, NP 01/12/20 1036

## 2020-01-12 NOTE — ED Triage Notes (Signed)
Pt presents with headache, nausea and chills x 6 days. Denies fever, SOB, cough. Pt had a negative COVID test 4 days ago.

## 2020-01-14 LAB — SARS-COV-2, NAA 2 DAY TAT

## 2020-01-14 LAB — NOVEL CORONAVIRUS, NAA: SARS-CoV-2, NAA: NOT DETECTED

## 2020-01-19 ENCOUNTER — Other Ambulatory Visit: Payer: Self-pay

## 2020-01-21 ENCOUNTER — Encounter: Payer: Self-pay | Admitting: Nurse Practitioner

## 2020-01-21 ENCOUNTER — Other Ambulatory Visit: Payer: Self-pay

## 2020-01-21 ENCOUNTER — Ambulatory Visit: Payer: BC Managed Care – PPO | Admitting: Nurse Practitioner

## 2020-01-21 VITALS — BP 108/80 | HR 62 | Temp 97.9°F | Ht 66.0 in | Wt 140.0 lb

## 2020-01-21 DIAGNOSIS — Z1389 Encounter for screening for other disorder: Secondary | ICD-10-CM | POA: Insufficient documentation

## 2020-01-21 DIAGNOSIS — K589 Irritable bowel syndrome without diarrhea: Secondary | ICD-10-CM | POA: Insufficient documentation

## 2020-01-21 DIAGNOSIS — Z0000 Encounter for general adult medical examination without abnormal findings: Secondary | ICD-10-CM | POA: Diagnosis not present

## 2020-01-21 DIAGNOSIS — E559 Vitamin D deficiency, unspecified: Secondary | ICD-10-CM

## 2020-01-21 NOTE — Patient Instructions (Addendum)
Please sign medical release for labs from your OB/GYN office visit.  Dental care every 6 months.  Recommend eye exam annually. Discussed the mammogram with your GYN as planned.  Continue with healthy lifestyle.   Follow-up with [place] as needed and otherwise annually. Vitamin D Deficiency Vitamin D deficiency is when your body does not have enough vitamin D. Vitamin D is important to your body for many reasons:  It helps the body absorb two important minerals--calcium and phosphorus.  It plays a role in bone health.  It may help to prevent some diseases, such as diabetes and multiple sclerosis.  It plays a role in muscle function, including heart function. If vitamin D deficiency is severe, it can cause a condition in which your bones become soft. In adults, this condition is called osteomalacia. In children, this condition is called rickets. What are the causes? This condition may be caused by:  Not eating enough foods that contain vitamin D.  Not getting enough natural sun exposure.  Having certain digestive system diseases that make it difficult for your body to absorb vitamin D. These diseases include Crohn's disease, chronic pancreatitis, and cystic fibrosis.  Having a surgery in which a part of the stomach or a part of the small intestine is removed.  Having chronic kidney disease or liver disease. What increases the risk? You are more likely to develop this condition if you:  Are older.  Do not spend much time outdoors.  Live in a long-term care facility.  Have had broken bones.  Have weak or thin bones (osteoporosis).  Have a disease or condition that changes how the body absorbs vitamin D.  Have dark skin.  Take certain medicines, such as steroid medicines or certain seizure medicines.  Are overweight or obese. What are the signs or symptoms? In mild cases of vitamin D deficiency, there may not be any symptoms. If the condition is severe, symptoms may  include:  Bone pain.  Muscle pain.  Falling often.  Broken bones caused by a minor injury. How is this diagnosed? This condition may be diagnosed with blood tests. Imaging tests such as X-rays may also be done to look for changes in the bone. How is this treated? Treatment for this condition may depend on what caused the condition. Treatment options include:  Taking vitamin D supplements. Your health care provider will suggest what dose is best for you.  Taking a calcium supplement. Your health care provider will suggest what dose is best for you. Follow these instructions at home: Eating and drinking   Eat foods that contain vitamin D. Choices include: ? Fortified dairy products, cereals, or juices. Fortified means that vitamin D has been added to the food. Check the label on the package to see if the food is fortified. ? Fatty fish, such as salmon or trout. ? Eggs. ? Oysters. ? Mushrooms. The items listed above may not be a complete list of recommended foods and beverages. Contact a dietitian for more information. General instructions  Take medicines and supplements only as told by your health care provider.  Get regular, safe exposure to natural sunlight.  Do not use a tanning bed.  Maintain a healthy weight. Lose weight if needed.  Keep all follow-up visits as told by your health care provider. This is important. How is this prevented? You can get vitamin D by:  Eating foods that naturally contain vitamin D.  Eating or drinking products that have been fortified with vitamin D, such as  cereals, juices, and dairy products (including milk).  Taking a vitamin D supplement or a multivitamin supplement that contains vitamin D.  Being in the sun. Your body naturally makes vitamin D when your skin is exposed to sunlight. Your body changes the sunlight into a form of the vitamin that it can use. Contact a health care provider if:  Your symptoms do not go away.  You  feel nauseous or you vomit.  You have fewer bowel movements than usual or are constipated. Summary  Vitamin D deficiency is when your body does not have enough vitamin D.  Vitamin D is important to your body for good bone health and muscle function, and it may help prevent some diseases.  Vitamin D deficiency is primarily treated through supplementation. Your health care provider will suggest what dose is best for you.  You can get vitamin D by eating foods that contain vitamin D, by being in the sun, and by taking a vitamin D supplement or a multivitamin supplement that contains vitamin D. This information is not intended to replace advice given to you by your health care provider. Make sure you discuss any questions you have with your health care provider. Document Revised: 01/14/2018 Document Reviewed: 01/14/2018 Elsevier Patient Education  2020 [place] 46-67 Years Old, Female Preventive care refers to visits with your health care provider and lifestyle choices that can promote health and wellness. This includes:  A yearly physical exam. This may also be called an annual well check.  Regular dental visits and eye exams.  Immunizations.  Screening for certain conditions.  Healthy lifestyle choices, such as eating a healthy diet, getting regular exercise, not using drugs or products that contain nicotine and tobacco, and limiting alcohol use. What can I expect for my preventive care visit? Physical exam Your health care provider will check your:  Height and weight. This may be used to calculate body mass index (BMI), which tells if you are at a healthy weight.  Heart rate and blood pressure.  Skin for abnormal spots. Counseling Your health care provider may ask you questions about your:  Alcohol, tobacco, and drug use.  Emotional well-being.  Home and relationship well-being.  Sexual activity.  Eating habits.  Work and work  Statistician.  Method of birth control.  Menstrual cycle.  Pregnancy history. What immunizations do I need?  Influenza (flu) vaccine  This is recommended every year. Tetanus, diphtheria, and pertussis (Tdap) vaccine  You may need a Td booster every 10 years. Varicella (chickenpox) vaccine  You may need this if you have not been vaccinated. Human papillomavirus (HPV) vaccine  If recommended by your health care provider, you may need three doses over 6 months. Measles, mumps, and rubella (MMR) vaccine  You may need at least one dose of MMR. You may also need a second dose. Meningococcal conjugate (MenACWY) vaccine  One dose is recommended if you are age 36-21 years and a first-year college student living in a residence hall, or if you have one of several medical conditions. You may also need additional booster doses. Pneumococcal conjugate (PCV13) vaccine  You may need this if you have certain conditions and were not previously vaccinated. Pneumococcal polysaccharide (PPSV23) vaccine  You may need one or two doses if you smoke cigarettes or if you have certain conditions. Hepatitis A vaccine  You may need this if you have certain conditions or if you travel or work in places where you may be exposed to hepatitis  A. Hepatitis B vaccine  You may need this if you have certain conditions or if you travel or work in places where you may be exposed to hepatitis B. Haemophilus influenzae type b (Hib) vaccine  You may need this if you have certain conditions. You may receive vaccines as individual doses or as more than one vaccine together in one shot (combination vaccines). Talk with your health care provider about the risks and benefits of combination vaccines. What tests do I need?  Blood tests  Lipid and cholesterol levels. These may be checked every 5 years starting at age 71.  Hepatitis C test.  Hepatitis B test. Screening  Diabetes screening. This is done by checking  your blood sugar (glucose) after you have not eaten for a while (fasting).  Sexually transmitted disease (STD) testing.  BRCA-related cancer screening. This may be done if you have a family history of breast, ovarian, tubal, or peritoneal cancers.  Pelvic exam and Pap test. This may be done every 3 years starting at age 13. Starting at age 17, this may be done every 5 years if you have a Pap test in combination with an HPV test. Talk with your health care provider about your test results, treatment options, and if necessary, the need for more tests. Follow these instructions at home: Eating and drinking   Eat a diet that includes fresh fruits and vegetables, whole grains, lean protein, and low-fat dairy.  Take vitamin and mineral supplements as recommended by your health care provider.  Do not drink alcohol if: ? Your health care provider tells you not to drink. ? You are pregnant, may be pregnant, or are planning to become pregnant.  If you drink alcohol: ? Limit how much you have to 0-1 drink a day. ? Be aware of how much alcohol is in your drink. In the [place] one drink equals one 12 oz bottle of beer (355 mL), one 5 oz glass of wine (148 mL), or one 1 oz glass of hard liquor (44 mL). Lifestyle  Take daily care of your teeth and gums.  Stay active. Exercise for at least 30 minutes on 5 or more days each week.  Do not use any products that contain nicotine or tobacco, such as cigarettes, e-cigarettes, and chewing tobacco. If you need help quitting, ask your health care provider.  If you are sexually active, practice safe sex. Use a condom or other form of birth control (contraception) in order to prevent pregnancy and STIs (sexually transmitted infections). If you plan to become pregnant, see your health care provider for a preconception visit. What's next?  Visit your health care provider once a year for a well check visit.  Ask your health care provider how often you should  have your eyes and teeth checked.  Stay up to date on all vaccines. This information is not intended to replace advice given to you by your health care provider. Make sure you discuss any questions you have with your health care provider. Document Revised: 01/17/2018 Document Reviewed: 01/17/2018 Elsevier Patient Education  2020 [employer].

## 2020-01-21 NOTE — Progress Notes (Signed)
New Patient Office Visit  Subjective:  Patient ID: Amanda [name], female    DOB: 1985-10-03  Age: 34 y.o. MRN: [id number]  CC:  Chief Complaint  Patient presents with  . New Patient (Initial Visit)    establish care    HPI [name] is a 34 year old who presents to establish care with primary care provider.  She reports no specific concerns today.  She was in the [employer] for 13 years and had regular physical exams are performed by them.  She left  the [employer] in 2019.  She has not had a physical since then.  .   Fatigue: Patient has had some mild fatigue, laboratory studies showed a low vitamin D so she has been on high-dose for 8 weeks.   MVP-pmhx- negative cardiac w/up during pregnancy: Remote heart murmur is a 3-yr-old, has never bothered her in her adult life.    GYN CARE:She denies any past medical history except one abnormal Pap smear in college,LEEP,  normal Pap smears since then.  She receives her GYN care at [facility] and her next Pap test is due next year.  There is family history of breast cancer in maternal grandmother, so she is considering her mammogram anytime now Last menstrual period is normal.  For birth control she is using husband's vasectomy.   Past Medical History:  Diagnosis Date  . Abnormal Pap smear   . Endometriosis   . Heart murmur   . Miscarriage   . MVP (mitral valve prolapse)     Past Surgical History:  Procedure Laterality Date  . CERVICAL BIOPSY  W/ LOOP ELECTRODE EXCISION  2004  . WISDOM TOOTH EXTRACTION      Family History  Problem Relation Age of Onset  . Hypertension Mother        hyperlipidemia  . Obesity Mother   . Heart disease Father   . Alcohol abuse Father   . Asthma Sister   . Obesity Sister   . Cancer Maternal Uncle   . Breast cancer Maternal Grandmother   . Hyperlipidemia Maternal Grandmother   . Cancer Maternal Grandmother   . Hypertension Maternal Grandfather   . Hyperlipidemia Maternal Grandfather     . Diabetes Maternal Grandfather   . Diabetes Paternal Grandfather   . Hypertension Paternal Grandfather   . Heart disease Paternal Grandfather        CABGx3  . Heart attack Paternal Grandfather     Social History   Socioeconomic History  . Marital status: Married    Spouse name: [name]  . Number of children: 3  . Years of education: college  . Highest education level: Bachelor's degree (e.g., BA, AB, BS)  Occupational History  . Not on file  Tobacco Use  . Smoking status: Never Smoker  . Smokeless tobacco: Never Used  Vaping Use  . Vaping Use: Never used  Substance and Sexual Activity  . Alcohol use: No  . Drug use: No  . Sexual activity: Yes    Birth control/protection: None  Other Topics Concern  . Not on file  Social History Narrative   Patient lives at home with her husband ([name]).  Patient has her associates degree. Patient has 3 kids.    Right handed. Caffeine None      Works as Consulting civil engineer Strain:   . Difficulty of Paying Living Expenses: Not on file  Food Insecurity:   . Worried About Running  Out of Food in the Last Year: Not on file  . Ran Out of Food in the Last Year: Not on file  Transportation Needs:   . Lack of Transportation (Medical): Not on file  . Lack of Transportation (Non-Medical): Not on file  Physical Activity:   . Days of Exercise per Week: Not on file  . Minutes of Exercise per Session: Not on file  Stress:   . Feeling of Stress : Not on file  Social Connections:   . Frequency of Communication with Friends and Family: Not on file  . Frequency of Social Gatherings with Friends and Family: Not on file  . Attends Religious Services: Not on file  . Active Member of Clubs or Organizations: Not on file  . Attends Banker Meetings: Not on file  . Marital Status: Not on file  Intimate Partner Violence:   . Fear of Current or Ex-Partner: Not on file  . Emotionally  Abused: Not on file  . Physically Abused: Not on file  . Sexually Abused: Not on file    Review of Systems  Constitutional: Negative.   HENT: Negative.   Eyes: Negative.   Respiratory: Negative.   Cardiovascular: Negative.   Gastrointestinal: Negative.   Endocrine: Negative.   Genitourinary: Negative.   Musculoskeletal: Negative.   Skin: Negative.   Allergic/Immunologic: Negative.   Neurological: Negative.   Hematological: Negative.   Psychiatric/Behavioral: Negative.     Objective:   Today's Vitals: BP 108/80 (BP Location: Left Arm, Patient Position: Sitting, Cuff Size: Normal)   Pulse 62   Temp 97.9 F (36.6 C) (Oral)   Ht 5\' 6"  (1.676 m)   Wt 140 lb (63.5 kg)   SpO2 96%   BMI 22.60 kg/m   Physical Exam Vitals reviewed.  Constitutional:      Appearance: Normal appearance. She is not ill-appearing.  HENT:     Head: Atraumatic.     Mouth/Throat:     Comments: Wearing mask secondary to COVID pandemic. Eyes:     General: No scleral icterus.    Conjunctiva/sclera: Conjunctivae normal.     Pupils: Pupils are equal, round, and reactive to light.  Cardiovascular:     Rate and Rhythm: Regular rhythm.     Heart sounds: No murmur heard.  No gallop.   Pulmonary:     Effort: Pulmonary effort is normal.     Breath sounds: Normal breath sounds.  Abdominal:     General: Bowel sounds are normal.     Palpations: Abdomen is soft.     Tenderness: There is no abdominal tenderness.  Musculoskeletal:        General: No swelling or tenderness. Normal range of motion.     Cervical back: Normal range of motion and neck supple.  Skin:    General: Skin is warm and dry.     Findings: No rash.  Neurological:     General: No focal deficit present.     Mental Status: She is alert and oriented to person, place, and time.  Psychiatric:        Mood and Affect: Mood normal.        Behavior: Behavior normal.     Assessment & Plan:   Problem List Items Addressed This Visit        Other   Vitamin D deficiency   Encounter for medical examination to establish care - Primary      Outpatient Encounter Medications as of 01/21/2020  Medication Sig  .  COLLAGEN PO Take by mouth.  Marland Kitchen MAGNESIUM-CALCIUM-FOLIC ACID PO Take by mouth.  Marland Kitchen VITAMIN D PO Take by mouth.  . [DISCONTINUED] ibuprofen (ADVIL) 800 MG tablet Take 1 tablet (800 mg total) by mouth 3 (three) times daily.  . [DISCONTINUED] ondansetron (ZOFRAN-ODT) 4 MG disintegrating tablet Take 1 tablet (4 mg total) by mouth every 8 (eight) hours as needed for nausea or vomiting.  . [DISCONTINUED] Prenatal Vit-Fe Fumarate-FA (PRENATAL MULTIVITAMIN) TABS Take 1 tablet by mouth daily.   No facility-administered encounter medications on file as of 01/21/2020.   Advised: Please sign medical release for labs from your OB/GYN office visit.  Dental care every 6 months.  Recommend eye exam annually. Discussed the mammogram with your GYN as planned.  Continue with healthy lifestyle.   Follow-up with [place] as needed and otherwise annually.  Follow-up: Return in about 1 year (around 01/20/2021).  This visit occurred during the SARS-CoV-2 public health emergency.  Safety protocols were in place, including screening questions prior to the visit, additional usage of staff PPE, and extensive cleaning of exam room while observing appropriate contact time as indicated for disinfecting solutions.   Amedeo Kinsman, NP

## 2020-01-23 ENCOUNTER — Encounter: Payer: Self-pay | Admitting: Nurse Practitioner

## 2020-06-12 ENCOUNTER — Other Ambulatory Visit: Payer: Self-pay

## 2020-06-12 ENCOUNTER — Ambulatory Visit
Admission: EM | Admit: 2020-06-12 | Discharge: 2020-06-12 | Disposition: A | Discharge status: Home / Self Care | Payer: BC Managed Care – PPO | Attending: Family Medicine | Admitting: Family Medicine

## 2020-06-12 ENCOUNTER — Encounter: Payer: Self-pay | Admitting: Emergency Medicine

## 2020-06-12 DIAGNOSIS — L255 Unspecified contact dermatitis due to plants, except food: Secondary | ICD-10-CM | POA: Diagnosis not present

## 2020-06-12 MED ORDER — TRIAMCINOLONE ACETONIDE 0.1 % EX CREA: 1.0000 "application " | TOPICAL_CREAM | Freq: Two times a day (BID) | CUTANEOUS | 0 refills | Status: DC

## 2020-06-12 MED ORDER — PREDNISONE 50 MG PO TABS
60.0000 mg | ORAL_TABLET | Freq: Once | ORAL | Status: AC
  Administered 2020-06-12: 60 mg via ORAL

## 2020-06-12 NOTE — ED Triage Notes (Signed)
Itchy rash on left side of body started 2 weeks ago

## 2020-06-12 NOTE — Discharge Instructions (Signed)
You have received prednisone in the office today to help with itching and inflammation  I have sent in triamcinolone cream for you to use to the area twice a day as needed.  Follow up with this office or with primary care if symptoms are persisting.  Follow up in the ER for high fever, trouble swallowing, trouble breathing, other concerning symptoms.

## 2020-06-12 NOTE — ED Provider Notes (Signed)
[facility]   [id number] 06/12/20 Arrival Time: 0955  CC: RASH  SUBJECTIVE:  Amanda [name] is a 35 y.o. female who presents with a skin complaint that began about 2 weeks ago. Reports that she was helping her father take down trees. Reports hx dermatitis with poison ivy. Denies precipitating event or trauma. Has been using Benadryl gel and calamine lotion with little relief. Reports blistered, red, raised and itchy rash to the dorsal surface of the L hand, L hip, L leg and knee. There are no aggravating or alleviating factors.  Denies fever, chills, nausea, vomiting, swelling, discharge, oral lesions, SOB, chest pain, abdominal pain, changes in bowel or bladder function.    ROS: As per HPI.  All other pertinent ROS negative.     Past Medical History:  Diagnosis Date  . Abnormal Pap smear   . Endometriosis   . Heart murmur   . Miscarriage   . MVP (mitral valve prolapse)    Past Surgical History:  Procedure Laterality Date  . CERVICAL BIOPSY  W/ LOOP ELECTRODE EXCISION  2004  . CERVICAL BIOPSY  W/ LOOP ELECTRODE EXCISION     college  . WISDOM TOOTH EXTRACTION     No Known Allergies No current facility-administered medications on file prior to encounter.   Current Outpatient Medications on File Prior to Encounter  Medication Sig Dispense Refill  . COLLAGEN PO Take by mouth.    Marland Kitchen MAGNESIUM-CALCIUM-FOLIC ACID PO Take by mouth.    Marland Kitchen VITAMIN D PO Take by mouth.     Social History   Socioeconomic History  . Marital status: Married    Spouse name: [name]  . Number of children: 3  . Years of education: college  . Highest education level: Bachelor's degree (e.g., BA, AB, BS)  Occupational History  . Not on file  Tobacco Use  . Smoking status: Never Smoker  . Smokeless tobacco: Never Used  Vaping Use  . Vaping Use: Never used  Substance and Sexual Activity  . Alcohol use: No  . Drug use: No  . Sexual activity: Yes    Birth control/protection: None  Other  Topics Concern  . Not on file  Social History Narrative   Patient lives at home with her husband ([name]).  Patient has her associates degree. Patient has 3 kids.    Right handed. 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 Hypertension Mother        hyperlipidemia  . Obesity Mother   . Heart disease Father   . Alcohol abuse Father   . Asthma Sister   . Obesity Sister   . Cancer Maternal Uncle   . Breast cancer Maternal Grandmother   . Hyperlipidemia Maternal Grandmother   . Cancer Maternal Grandmother   . Hypertension Maternal Grandfather   . Hyperlipidemia Maternal Grandfather   . Diabetes Maternal Grandfather   . Diabetes Paternal Grandfather   . Hypertension Paternal Grandfather   . Heart disease Paternal Grandfather        CABGx3  . Heart attack Paternal Grandfather     OBJECTIVE: Vitals:   06/12/20 1003  06/12/20 1005  BP: 104/73   Pulse: 97   Resp: 16   Temp: 98.2 F (36.8 C)   TempSrc: Oral   SpO2: 99%   Weight:  134 lb (60.8 kg)  Height:  5' 5.5" (1.664 m)    General appearance: alert; no distress Head: NCAT Lungs: clear to auscultation bilaterally Heart: regular rate and rhythm.  Radial pulse 2+ bilaterally Extremities: no edema Skin: warm and dry; erythematous, vesicular rash to dorsal surface of L hand, L hip, L leg and knee Psychological: alert and cooperative; normal mood and affect  ASSESSMENT & PLAN:  1. Contact dermatitis due to plants, except food, unspecified contact dermatitis type     Meds ordered this encounter  Medications  . triamcinolone (KENALOG) 0.1 %    Sig: Apply 1 application topically 2 (two) times daily.     Dispense:  30 g    Refill:  0    Order Specific Question:   Supervising Provider    Answer:   Merrilee Jansky [id number]  . predniSONE (DELTASONE) tablet 60 mg    Prednisone given in office today Prescribed triamcinolone cream Take as prescribed and to completion Avoid hot showers/ baths Moisturize skin daily  Follow up with PCP if symptoms persists Return or go to the ER if you have any new or worsening symptoms such as fever, chills, nausea, vomiting, redness, swelling, discharge, if symptoms do not improve with medications  Reviewed expectations re: course of current medical issues. Questions answered. Outlined signs and symptoms indicating need for more acute intervention. Patient verbalized understanding. After Visit Summary given.   Moshe Cipro, NP 06/12/20 [phone]

## 2020-06-22 DIAGNOSIS — Z20822 Contact with and (suspected) exposure to covid-19: Secondary | ICD-10-CM | POA: Diagnosis not present

## 2020-07-28 ENCOUNTER — Encounter: Payer: Self-pay | Admitting: Emergency Medicine

## 2020-07-28 ENCOUNTER — Ambulatory Visit
Admission: EM | Admit: 2020-07-28 | Discharge: 2020-07-28 | Disposition: A | Discharge status: Home / Self Care | Payer: BC Managed Care – PPO | Attending: Emergency Medicine | Admitting: Emergency Medicine

## 2020-07-28 ENCOUNTER — Other Ambulatory Visit: Payer: Self-pay

## 2020-07-28 DIAGNOSIS — L259 Unspecified contact dermatitis, unspecified cause: Secondary | ICD-10-CM | POA: Diagnosis not present

## 2020-07-28 DIAGNOSIS — R21 Rash and other nonspecific skin eruption: Secondary | ICD-10-CM

## 2020-07-28 MED ORDER — PREDNISONE 20 MG PO TABS: 20.0000 mg | ORAL_TABLET | Freq: Two times a day (BID) | ORAL | 0 refills | Status: AC

## 2020-07-28 NOTE — ED Provider Notes (Signed)
[facility]   [id number] 07/28/20 Arrival Time: 1012  CC: Rash  SUBJECTIVE:  Amanda [name] is a 35 y.o. female who presents with a rash to bilateral hands and LT side x 1 day ago.  Denies precipitating event or trauma.  Describes it as itchy.  Has tried triamcinolone cream without relief.  Symptoms are made worse with itching.  Reports similar symptoms in the past that improved with steroid and cream.   Denies fever, chills, nausea, vomiting, erythema, swelling, discharge, SOB, chest pain, abdominal pain, changes in bowel or bladder function.    ROS: As per HPI.  All other pertinent ROS negative.     Past Medical History:  Diagnosis Date  . Abnormal Pap smear   . Endometriosis   . Heart murmur   . Miscarriage   . MVP (mitral valve prolapse)    Past Surgical History:  Procedure Laterality Date  . CERVICAL BIOPSY  W/ LOOP ELECTRODE EXCISION  2004  . CERVICAL BIOPSY  W/ LOOP ELECTRODE EXCISION     college  . WISDOM TOOTH EXTRACTION     No Known Allergies No current facility-administered medications on file prior to encounter.   Current Outpatient Medications on File Prior to Encounter  Medication Sig Dispense Refill  . COLLAGEN PO Take by mouth.    Marland Kitchen MAGNESIUM-CALCIUM-FOLIC ACID PO Take by mouth.    . triamcinolone (KENALOG) 0.1 % Apply 1 application topically 2 (two) times daily. 30 g 0  . VITAMIN D PO Take by mouth.     Social History   Socioeconomic History  . Marital status: Married    Spouse name: [name]  . Number of children: 3  . Years of education: college  . Highest education level: Bachelor's degree (e.g., BA, AB, BS)  Occupational History  . Not on file  Tobacco Use  . Smoking status: Never Smoker  . Smokeless tobacco: Never Used  Vaping Use  . Vaping Use: Never used  Substance and Sexual Activity  . Alcohol use: No  . Drug use: No  . Sexual activity: Yes    Birth control/protection: None  Other Topics Concern  . Not on file  Social  History Narrative   Patient lives at home with her husband ([name]).  Patient has her associates degree. Patient has 3 kids.    Right handed. 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 Hypertension Mother        hyperlipidemia  . Obesity Mother   . Heart disease Father   . Alcohol abuse Father   . Asthma Sister   . Obesity Sister   . Cancer Maternal Uncle   . Breast cancer Maternal Grandmother   . Hyperlipidemia Maternal Grandmother   . Cancer Maternal Grandmother   . Hypertension Maternal Grandfather   . Hyperlipidemia Maternal Grandfather   . Diabetes Maternal Grandfather   . Diabetes Paternal Grandfather   . Hypertension Paternal Grandfather   . Heart disease Paternal Grandfather        CABGx3  . Heart attack Paternal Grandfather     OBJECTIVE: Vitals:   07/28/20 1021  BP:  107/70  Pulse: 64  Resp: 12  Temp: 97.8 F (36.6 C)  TempSrc: Oral  SpO2: 95%    General appearance: alert; no distress Head: NCAT Lungs: normal respiratory effort Extremities: no edema Skin: warm and dry; erythematous papules and macules, NTTP, no discharge or drainage to bilateral hands Psychological: alert and cooperative; normal mood and affect  ASSESSMENT & PLAN:  1. Rash and nonspecific skin eruption   2. Contact dermatitis, unspecified contact dermatitis type, unspecified trigger     Meds ordered this encounter  Medications  . predniSONE (DELTASONE) 20 MG tablet    Sig: Take 1 tablet (20 mg total) by mouth 2 (two) times daily with a meal for 5 days.    Dispense:  10 tablet    Refill:  0    Order Specific Question:   Supervising Provider    Answer:    Eustace Moore [[id number]]   Wash with warm water and mild soaps Prednisone prescribed.  Take as directed and to completion Follow up with PCP if symptoms persists Return or go to the ER if you have any new or worsening symptoms such as fever, chills, nausea, vomiting, redness, swelling, discharge, if symptoms do not improve with medications, etc...  Reviewed expectations re: course of current medical issues. Questions answered. Outlined signs and symptoms indicating need for more acute intervention. Patient verbalized understanding. After Visit Summary given.   Rennis Harding, PA-C 07/28/20 1115

## 2020-07-28 NOTE — ED Triage Notes (Addendum)
Rash to LT and RT  and LT side that is itchy. Pt had similar rash in [place] and was seen here.  Has been using cream rx she got here with no relief.

## 2020-07-28 NOTE — Discharge Instructions (Addendum)
Wash with warm water and mild soaps Prednisone prescribed.  Take as directed and to completion Follow up with PCP if symptoms persists Return or go to the ER if you have any new or worsening symptoms such as fever, chills, nausea, vomiting, redness, swelling, discharge, if symptoms do not improve with medications, etc..Marland Kitchen

## 2020-09-20 DIAGNOSIS — M9901 Segmental and somatic dysfunction of cervical region: Secondary | ICD-10-CM | POA: Diagnosis not present

## 2020-09-20 DIAGNOSIS — M62830 Muscle spasm of back: Secondary | ICD-10-CM | POA: Diagnosis not present

## 2020-09-20 DIAGNOSIS — M5412 Radiculopathy, cervical region: Secondary | ICD-10-CM | POA: Diagnosis not present

## 2020-09-20 DIAGNOSIS — M9902 Segmental and somatic dysfunction of thoracic region: Secondary | ICD-10-CM | POA: Diagnosis not present

## 2020-09-21 DIAGNOSIS — M62830 Muscle spasm of back: Secondary | ICD-10-CM | POA: Diagnosis not present

## 2020-09-21 DIAGNOSIS — M5412 Radiculopathy, cervical region: Secondary | ICD-10-CM | POA: Diagnosis not present

## 2020-09-21 DIAGNOSIS — M9902 Segmental and somatic dysfunction of thoracic region: Secondary | ICD-10-CM | POA: Diagnosis not present

## 2020-09-21 DIAGNOSIS — M9901 Segmental and somatic dysfunction of cervical region: Secondary | ICD-10-CM | POA: Diagnosis not present

## 2020-09-22 DIAGNOSIS — M5412 Radiculopathy, cervical region: Secondary | ICD-10-CM | POA: Diagnosis not present

## 2020-09-22 DIAGNOSIS — M9902 Segmental and somatic dysfunction of thoracic region: Secondary | ICD-10-CM | POA: Diagnosis not present

## 2020-09-22 DIAGNOSIS — M9901 Segmental and somatic dysfunction of cervical region: Secondary | ICD-10-CM | POA: Diagnosis not present

## 2020-09-22 DIAGNOSIS — M62830 Muscle spasm of back: Secondary | ICD-10-CM | POA: Diagnosis not present

## 2020-09-23 DIAGNOSIS — M9902 Segmental and somatic dysfunction of thoracic region: Secondary | ICD-10-CM | POA: Diagnosis not present

## 2020-09-23 DIAGNOSIS — M62830 Muscle spasm of back: Secondary | ICD-10-CM | POA: Diagnosis not present

## 2020-09-23 DIAGNOSIS — M5412 Radiculopathy, cervical region: Secondary | ICD-10-CM | POA: Diagnosis not present

## 2020-09-23 DIAGNOSIS — M9901 Segmental and somatic dysfunction of cervical region: Secondary | ICD-10-CM | POA: Diagnosis not present

## 2020-09-27 DIAGNOSIS — M5412 Radiculopathy, cervical region: Secondary | ICD-10-CM | POA: Diagnosis not present

## 2020-09-27 DIAGNOSIS — M62830 Muscle spasm of back: Secondary | ICD-10-CM | POA: Diagnosis not present

## 2020-09-27 DIAGNOSIS — M9901 Segmental and somatic dysfunction of cervical region: Secondary | ICD-10-CM | POA: Diagnosis not present

## 2020-09-27 DIAGNOSIS — M9902 Segmental and somatic dysfunction of thoracic region: Secondary | ICD-10-CM | POA: Diagnosis not present

## 2020-09-29 DIAGNOSIS — M9901 Segmental and somatic dysfunction of cervical region: Secondary | ICD-10-CM | POA: Diagnosis not present

## 2020-09-29 DIAGNOSIS — M62830 Muscle spasm of back: Secondary | ICD-10-CM | POA: Diagnosis not present

## 2020-09-29 DIAGNOSIS — M9902 Segmental and somatic dysfunction of thoracic region: Secondary | ICD-10-CM | POA: Diagnosis not present

## 2020-09-29 DIAGNOSIS — M5412 Radiculopathy, cervical region: Secondary | ICD-10-CM | POA: Diagnosis not present

## 2020-09-30 DIAGNOSIS — M5412 Radiculopathy, cervical region: Secondary | ICD-10-CM | POA: Diagnosis not present

## 2020-09-30 DIAGNOSIS — M62830 Muscle spasm of back: Secondary | ICD-10-CM | POA: Diagnosis not present

## 2020-09-30 DIAGNOSIS — M9902 Segmental and somatic dysfunction of thoracic region: Secondary | ICD-10-CM | POA: Diagnosis not present

## 2020-09-30 DIAGNOSIS — M9901 Segmental and somatic dysfunction of cervical region: Secondary | ICD-10-CM | POA: Diagnosis not present

## 2020-10-05 DIAGNOSIS — M62830 Muscle spasm of back: Secondary | ICD-10-CM | POA: Diagnosis not present

## 2020-10-05 DIAGNOSIS — M9902 Segmental and somatic dysfunction of thoracic region: Secondary | ICD-10-CM | POA: Diagnosis not present

## 2020-10-05 DIAGNOSIS — M9901 Segmental and somatic dysfunction of cervical region: Secondary | ICD-10-CM | POA: Diagnosis not present

## 2020-10-05 DIAGNOSIS — M5412 Radiculopathy, cervical region: Secondary | ICD-10-CM | POA: Diagnosis not present

## 2020-10-07 DIAGNOSIS — M9901 Segmental and somatic dysfunction of cervical region: Secondary | ICD-10-CM | POA: Diagnosis not present

## 2020-10-07 DIAGNOSIS — M62830 Muscle spasm of back: Secondary | ICD-10-CM | POA: Diagnosis not present

## 2020-10-07 DIAGNOSIS — M9902 Segmental and somatic dysfunction of thoracic region: Secondary | ICD-10-CM | POA: Diagnosis not present

## 2020-10-07 DIAGNOSIS — M5412 Radiculopathy, cervical region: Secondary | ICD-10-CM | POA: Diagnosis not present

## 2020-10-11 DIAGNOSIS — Z01419 Encounter for gynecological examination (general) (routine) without abnormal findings: Secondary | ICD-10-CM | POA: Diagnosis not present

## 2020-10-11 LAB — HM PAP SMEAR: HM Pap smear: NEGATIVE

## 2020-10-12 DIAGNOSIS — M9902 Segmental and somatic dysfunction of thoracic region: Secondary | ICD-10-CM | POA: Diagnosis not present

## 2020-10-12 DIAGNOSIS — M62830 Muscle spasm of back: Secondary | ICD-10-CM | POA: Diagnosis not present

## 2020-10-12 DIAGNOSIS — M9901 Segmental and somatic dysfunction of cervical region: Secondary | ICD-10-CM | POA: Diagnosis not present

## 2020-10-12 DIAGNOSIS — M5412 Radiculopathy, cervical region: Secondary | ICD-10-CM | POA: Diagnosis not present

## 2020-10-14 DIAGNOSIS — M62830 Muscle spasm of back: Secondary | ICD-10-CM | POA: Diagnosis not present

## 2020-10-14 DIAGNOSIS — M5412 Radiculopathy, cervical region: Secondary | ICD-10-CM | POA: Diagnosis not present

## 2020-10-14 DIAGNOSIS — M9901 Segmental and somatic dysfunction of cervical region: Secondary | ICD-10-CM | POA: Diagnosis not present

## 2020-10-14 DIAGNOSIS — M9902 Segmental and somatic dysfunction of thoracic region: Secondary | ICD-10-CM | POA: Diagnosis not present

## 2020-10-21 DIAGNOSIS — M9901 Segmental and somatic dysfunction of cervical region: Secondary | ICD-10-CM | POA: Diagnosis not present

## 2020-10-21 DIAGNOSIS — M62830 Muscle spasm of back: Secondary | ICD-10-CM | POA: Diagnosis not present

## 2020-10-21 DIAGNOSIS — M5412 Radiculopathy, cervical region: Secondary | ICD-10-CM | POA: Diagnosis not present

## 2020-10-21 DIAGNOSIS — M9902 Segmental and somatic dysfunction of thoracic region: Secondary | ICD-10-CM | POA: Diagnosis not present

## 2020-10-28 DIAGNOSIS — M9901 Segmental and somatic dysfunction of cervical region: Secondary | ICD-10-CM | POA: Diagnosis not present

## 2020-10-28 DIAGNOSIS — M62830 Muscle spasm of back: Secondary | ICD-10-CM | POA: Diagnosis not present

## 2020-10-28 DIAGNOSIS — M5412 Radiculopathy, cervical region: Secondary | ICD-10-CM | POA: Diagnosis not present

## 2020-10-28 DIAGNOSIS — M9902 Segmental and somatic dysfunction of thoracic region: Secondary | ICD-10-CM | POA: Diagnosis not present

## 2020-11-18 DIAGNOSIS — M9901 Segmental and somatic dysfunction of cervical region: Secondary | ICD-10-CM | POA: Diagnosis not present

## 2020-11-18 DIAGNOSIS — M5412 Radiculopathy, cervical region: Secondary | ICD-10-CM | POA: Diagnosis not present

## 2020-11-18 DIAGNOSIS — M9902 Segmental and somatic dysfunction of thoracic region: Secondary | ICD-10-CM | POA: Diagnosis not present

## 2020-11-18 DIAGNOSIS — M62830 Muscle spasm of back: Secondary | ICD-10-CM | POA: Diagnosis not present

## 2020-12-16 DIAGNOSIS — M9901 Segmental and somatic dysfunction of cervical region: Secondary | ICD-10-CM | POA: Diagnosis not present

## 2020-12-16 DIAGNOSIS — M62830 Muscle spasm of back: Secondary | ICD-10-CM | POA: Diagnosis not present

## 2020-12-16 DIAGNOSIS — M9902 Segmental and somatic dysfunction of thoracic region: Secondary | ICD-10-CM | POA: Diagnosis not present

## 2020-12-16 DIAGNOSIS — M5412 Radiculopathy, cervical region: Secondary | ICD-10-CM | POA: Diagnosis not present

## 2021-01-17 DIAGNOSIS — M5412 Radiculopathy, cervical region: Secondary | ICD-10-CM | POA: Diagnosis not present

## 2021-01-17 DIAGNOSIS — M62830 Muscle spasm of back: Secondary | ICD-10-CM | POA: Diagnosis not present

## 2021-01-17 DIAGNOSIS — M9901 Segmental and somatic dysfunction of cervical region: Secondary | ICD-10-CM | POA: Diagnosis not present

## 2021-01-17 DIAGNOSIS — M9902 Segmental and somatic dysfunction of thoracic region: Secondary | ICD-10-CM | POA: Diagnosis not present

## 2021-01-20 ENCOUNTER — Encounter: Payer: BC Managed Care – PPO | Admitting: Nurse Practitioner

## 2021-02-14 DIAGNOSIS — M62830 Muscle spasm of back: Secondary | ICD-10-CM | POA: Diagnosis not present

## 2021-02-14 DIAGNOSIS — M9902 Segmental and somatic dysfunction of thoracic region: Secondary | ICD-10-CM | POA: Diagnosis not present

## 2021-02-14 DIAGNOSIS — M9901 Segmental and somatic dysfunction of cervical region: Secondary | ICD-10-CM | POA: Diagnosis not present

## 2021-02-14 DIAGNOSIS — M5412 Radiculopathy, cervical region: Secondary | ICD-10-CM | POA: Diagnosis not present

## 2021-02-16 DIAGNOSIS — L700 Acne vulgaris: Secondary | ICD-10-CM | POA: Diagnosis not present

## 2021-03-16 DIAGNOSIS — M62830 Muscle spasm of back: Secondary | ICD-10-CM | POA: Diagnosis not present

## 2021-03-16 DIAGNOSIS — M9901 Segmental and somatic dysfunction of cervical region: Secondary | ICD-10-CM | POA: Diagnosis not present

## 2021-03-16 DIAGNOSIS — M9902 Segmental and somatic dysfunction of thoracic region: Secondary | ICD-10-CM | POA: Diagnosis not present

## 2021-03-16 DIAGNOSIS — M5412 Radiculopathy, cervical region: Secondary | ICD-10-CM | POA: Diagnosis not present

## 2021-04-07 DIAGNOSIS — L700 Acne vulgaris: Secondary | ICD-10-CM | POA: Diagnosis not present

## 2021-04-18 DIAGNOSIS — M9902 Segmental and somatic dysfunction of thoracic region: Secondary | ICD-10-CM | POA: Diagnosis not present

## 2021-04-18 DIAGNOSIS — M9901 Segmental and somatic dysfunction of cervical region: Secondary | ICD-10-CM | POA: Diagnosis not present

## 2021-04-18 DIAGNOSIS — M62830 Muscle spasm of back: Secondary | ICD-10-CM | POA: Diagnosis not present

## 2021-04-18 DIAGNOSIS — M5412 Radiculopathy, cervical region: Secondary | ICD-10-CM | POA: Diagnosis not present

## 2021-05-09 DIAGNOSIS — M62830 Muscle spasm of back: Secondary | ICD-10-CM | POA: Diagnosis not present

## 2021-05-09 DIAGNOSIS — M5412 Radiculopathy, cervical region: Secondary | ICD-10-CM | POA: Diagnosis not present

## 2021-05-09 DIAGNOSIS — M9902 Segmental and somatic dysfunction of thoracic region: Secondary | ICD-10-CM | POA: Diagnosis not present

## 2021-05-09 DIAGNOSIS — M9901 Segmental and somatic dysfunction of cervical region: Secondary | ICD-10-CM | POA: Diagnosis not present

## 2021-06-16 ENCOUNTER — Ambulatory Visit (INDEPENDENT_AMBULATORY_CARE_PROVIDER_SITE_OTHER): Payer: 59 | Admitting: Adult Health

## 2021-06-16 ENCOUNTER — Encounter: Payer: Self-pay | Admitting: Adult Health

## 2021-06-16 ENCOUNTER — Other Ambulatory Visit: Payer: Self-pay

## 2021-06-16 VITALS — BP 112/74 | HR 84 | Temp 98.2°F | Ht 65.5 in | Wt 130.4 lb

## 2021-06-16 DIAGNOSIS — Z808 Family history of malignant neoplasm of other organs or systems: Secondary | ICD-10-CM

## 2021-06-16 DIAGNOSIS — H6501 Acute serous otitis media, right ear: Secondary | ICD-10-CM | POA: Diagnosis not present

## 2021-06-16 DIAGNOSIS — E042 Nontoxic multinodular goiter: Secondary | ICD-10-CM | POA: Diagnosis not present

## 2021-06-16 DIAGNOSIS — R591 Generalized enlarged lymph nodes: Secondary | ICD-10-CM

## 2021-06-16 MED ORDER — AMOXICILLIN-POT CLAVULANATE 875-125 MG PO TABS: 1.0000 | ORAL_TABLET | Freq: Two times a day (BID) | ORAL | 0 refills | Status: DC

## 2021-06-16 NOTE — Patient Instructions (Signed)
Lymphadenopathy °Lymphadenopathy means that your lymph glands are swollen or larger than normal. Lymph glands, also called lymph nodes, are collections of tissue that filter excess fluid, bacteria, viruses, and waste from your bloodstream. They are part of your body's disease-fighting system (immune system), which protects your body from germs. °There may be different causes of lymphadenopathy, depending on where it is in your body. Some types go away on their own. Lymphadenopathy can occur anywhere that you have lymph glands, including these areas: °Neck (cervical lymphadenopathy). °Chest (mediastinal lymphadenopathy). °Lungs (hilar lymphadenopathy). °Underarms (axillary lymphadenopathy). °Groin (inguinal lymphadenopathy). °When your immune system responds to germs, infection-fighting cells and fluid build up in your lymph glands. This causes some swelling and enlargement. If the lymph nodes do not go back to normal size after you have an infection or disease, your health care provider may do tests. These tests help to monitor your condition and find the reason why the glands are still swollen and enlarged. °Follow these instructions at home: ° °Get plenty of rest. °Your health care provider may recommend over-the-counter medicines for pain. Take over-the-counter and prescription medicines only as told by your health care provider. °If directed, apply heat to swollen lymph glands as often as told by your health care provider. Use the heat source that your health care provider recommends, such as a moist heat pack or a heating pad. °Place a towel between your skin and the heat source. °Leave the heat on for 20-30 minutes. °Remove the heat if your skin turns bright red. This is especially important if you are unable to feel pain, heat, or cold. You may have a greater risk of getting burned. °Check your affected lymph glands every day for changes. Check other lymph gland areas as told by your health care provider.  Check for changes such as: °More swelling. °Sudden increase in size. °Redness or pain. °Hardness. °Keep all follow-up visits. This is important. °Contact a health care provider if you have: °Lymph glands that: °Are still swollen after 2 weeks. °Have suddenly gotten bigger or the swelling spreads. °Are red, painful, or hard. °Fluid leaking from the skin near an enlarged lymph gland. °Problems with breathing. °A fever, chills, or night sweats. °Fatigue. °A sore throat. °Pain in your abdomen. °Weight loss. °Get help right away if you have: °Severe pain. °Chest pain. °Shortness of breath. °These symptoms may represent a serious problem that is an emergency. Do not wait to see if the symptoms will go away. Get medical help right away. Call your local emergency services (911 in the [place] Do not drive yourself to the hospital. °Summary °Lymphadenopathy means that your lymph glands are swollen or larger than normal. °Lymph glands, also called lymph nodes, are collections of tissue that filter excess fluid, bacteria, viruses, and waste from the bloodstream. They are part of your body's disease-fighting system (immune system). °Lymphadenopathy can occur anywhere that you have lymph glands. °If the lymph nodes do not go back to normal size after you have an infection or disease, your health care provider may do tests to monitor your condition and find the reason why the glands are still swollen and enlarged. °Check your affected lymph glands every day for changes. Check other lymph gland areas as told by your health care provider. °This information is not intended to replace advice given to you by your health care provider. Make sure you discuss any questions you have with your health care provider. °Document Revised: 03/03/2020 Document Reviewed: 03/03/2020 °Elsevier Patient Education © 2022   [employer]. Amoxicillin; Clavulanic Acid Tablets What is this medication? AMOXICILLIN; CLAVULANIC ACID (a mox i SIL in; KLAV yoo  lan ic AS id) treats infections caused by bacteria. It belongs to a group of medications called penicillin antibiotics. It will not treat colds, the flu, or infections caused by viruses. This medicine may be used for other purposes; ask your health care provider or pharmacist if you have questions. COMMON BRAND NAME(S): Augmentin What should I tell my care team before I take this medication? They need to know if you have any of these conditions: Kidney disease Liver disease Mononucleosis Stomach or intestine problems such as colitis An unusual or allergic reaction to amoxicillin, other penicillin or cephalosporin antibiotics, clavulanic acid, other medications, foods, dyes, or preservatives Pregnant or trying to get pregnant Breast-feeding How should I use this medication? Take this medication by mouth. Take it as directed on the prescription label at the same time every day. Take it with food at the start of a meal or snack. Take all of this medication unless your care team tells you to stop it early. Keep taking it even if you think you are better. Talk to your care team about the use of this medication in children. While it may be prescribed for selected conditions, precautions do apply. Overdosage: If you think you have taken too much of this medicine contact a poison control center or emergency room at once. NOTE: This medicine is only for you. Do not share this medicine with others. What if I miss a dose? If you miss a dose, take it as soon as you can. If it is almost time for your next dose, take only that dose. Do not take double or extra doses. What may interact with this medication? Allopurinol Anticoagulants Birth control pills Methotrexate Probenecid This list may not describe all possible interactions. Give your health care provider a list of all the medicines, herbs, non-prescription drugs, or dietary supplements you use. Also tell them if you smoke, drink alcohol, or use  illegal drugs. Some items may interact with your medicine. What should I watch for while using this medication? Tell your care team if your symptoms do not start to get better or if they get worse. This medication may cause serious skin reactions. They can happen weeks to months after starting the medication. Contact your care team right away if you notice fevers or flu-like symptoms with a rash. The rash may be red or purple and then turn into blisters or peeling of the skin. Or, you might notice a red rash with swelling of the face, lips or lymph nodes in your neck or under your arms. Do not treat diarrhea with over the counter products. Contact your care team if you have diarrhea that lasts more than 2 days or if it is severe and watery. If you have diabetes, you may get a false-positive result for sugar in your urine. Check with your care team. Birth control may not work properly while you are taking this medication. Talk to your care team about using an extra method of birth control. What side effects may I notice from receiving this medication? Side effects that you should report to your care team as soon as possible: Allergic reactions--skin rash, itching, hives, swelling of the face, lips, tongue, or throat Liver injury--right upper belly pain, loss of appetite, nausea, light-colored stool, dark yellow or brown urine, yellowing skin or eyes, unusual weakness or fatigue Redness, blistering, peeling, or loosening of the  skin, including inside the mouth Severe diarrhea, fever Unusual vaginal discharge, itching, or odor Side effects that usually do not require medical attention (report to your care team if they continue or are bothersome): Diarrhea Nausea Vomiting This list may not describe all possible side effects. Call your doctor for medical advice about side effects. You may report side effects to FDA at 1-800-FDA-1088. Where should I keep my medication? Keep out of the reach of children  and pets. Store at room temperature between 20 and 25 degrees C (68 and 77 degrees F). Throw away any unused medication after the expiration date. NOTE: This sheet is a summary. It may not cover all possible information. If you have questions about this medicine, talk to your doctor, pharmacist, or health care provider.  2022 Elsevier/Gold Standard (2020-05-02 00:00:00)

## 2021-06-16 NOTE — Progress Notes (Signed)
Acute Office Visit  Subjective:    Patient ID: Amanda [name], female    DOB: 1985-06-21, 36 y.o.   MRN: [id number]  Chief Complaint  Patient presents with   lump on right side of neck     HPI Patient is in today for acute visit only. Needs establish care viist.  She has a lump on right side of neck  for 2 weeks and she reports she feels like her neck glands are all tight.  She did have URI around Christmas, with coughing and congestion. Cough and congestion seemed to have improved. She was sick for 1 plus weeks.   Denies any pain swallowing or problems swallowing.   No LMP recorded. Husband vasectomy. LMP was last 05/28/2021 regular.   Patient  denies any fever, body aches,chills, rash, chest pain, shortness of breath, nausea, vomiting, or diarrhea.  Denies dizziness, lightheadedness, pre syncopal or syncopal episodes.    Past Medical History:  Diagnosis Date   Abnormal Pap smear    Endometriosis    Heart murmur    Miscarriage    MVP (mitral valve prolapse)     Past Surgical History:  Procedure Laterality Date   CERVICAL BIOPSY  W/ LOOP ELECTRODE EXCISION  2004   CERVICAL BIOPSY  W/ LOOP ELECTRODE EXCISION     college   WISDOM TOOTH EXTRACTION      Family History  Problem Relation Age of Onset   Hypertension Mother        hyperlipidemia   Obesity Mother    Heart disease Father    Alcohol abuse Father    Asthma Sister    Obesity Sister    Cancer Maternal Uncle    Breast cancer Maternal Grandmother    Hyperlipidemia Maternal Grandmother    Cancer Maternal Grandmother    Hypertension Maternal Grandfather    Hyperlipidemia Maternal Grandfather    Diabetes Maternal Grandfather    Diabetes Paternal Grandfather    Hypertension Paternal Grandfather    Heart disease Paternal Grandfather        CABGx3   Heart attack Paternal Grandfather     Social History   Socioeconomic History   Marital status: Married    Spouse name: [name]   Number of children: 3    Years of education: college   Highest education level: Bachelor's degree (e.g., BA, AB, BS)  Occupational History   Not on file  Tobacco Use   Smoking status: Never   Smokeless tobacco: Never  Vaping Use   Vaping Use: Never used  Substance and Sexual Activity   Alcohol use: No   Drug use: No   Sexual activity: Yes    Birth control/protection: None  Other Topics Concern   Not on file  Social History Narrative   Patient lives at home with her husband ([name]).  Patient has her associates degree. Patient has 3 kids.    Right handed. Caffeine None      Works as Electrical engineer Strain: Not on [employer] Insecurity: Not on file  Transportation Needs: Not on file  Physical Activity: Not on file  Stress: Not on file  Social Connections: Not on file  Intimate Partner Violence: Not on file    Outpatient Medications Prior to Visit  Medication Sig Dispense Refill   COLLAGEN PO Take by mouth.     MAGNESIUM-CALCIUM-FOLIC ACID PO Take by mouth.     VITAMIN D PO Take by  mouth.     triamcinolone (KENALOG) 0.1 % Apply 1 application topically 2 (two) times daily. (Patient not taking: Reported on 06/16/2021) 30 g 0   No facility-administered medications prior to visit.    No Known Allergies  Review of Systems  Constitutional:  Positive for fatigue. Negative for activity change, appetite change, chills, diaphoresis, fever and unexpected weight change.  HENT:  Positive for congestion. Negative for trouble swallowing and voice change.   Respiratory: Negative.         Cough has resolved.   Cardiovascular: Negative.   Gastrointestinal: Negative.   Genitourinary: Negative.   Musculoskeletal: Negative.   Neurological: Negative.   Hematological:  Positive for adenopathy. Does not bruise/bleed easily.  Psychiatric/Behavioral: Negative.        Objective:    Physical Exam Vitals reviewed.  Constitutional:      Appearance: She is  well-developed.  HENT:     Head: Normocephalic.     Right Ear: Hearing, ear canal and external ear normal. A middle ear effusion is present. Tympanic membrane is injected and erythematous.     Left Ear: Hearing, tympanic membrane, ear canal and external ear normal.  No middle ear effusion. Tympanic membrane is not injected or erythematous.  Eyes:     General: Lids are normal. Lids are everted, no foreign bodies appreciated.     Extraocular Movements: Extraocular movements intact.     Conjunctiva/sclera: Conjunctivae normal.     Pupils: Pupils are equal, round, and reactive to light.  Neck:     Thyroid: Thyroid mass (small nodules felt on left and right side of thyroid seem to be soft and discrete.) present. No thyromegaly or thyroid tenderness.     Vascular: No carotid bruit.     Trachea: Trachea and phonation normal.  Cardiovascular:     Rate and Rhythm: Normal rate and regular rhythm.     Pulses: Normal pulses.     Heart sounds: Normal heart sounds.  Pulmonary:     Effort: Pulmonary effort is normal.     Breath sounds: Normal breath sounds. No wheezing, rhonchi or rales.  Musculoskeletal:     Cervical back: Full passive range of motion without pain, normal range of motion and neck supple. No muscular tenderness.  Lymphadenopathy:     Head:     Right side of head: Submandibular (small approximately 1cm soft mobile) adenopathy present. No submental, tonsillar, preauricular, posterior auricular or occipital adenopathy.     Left side of head: No submental, submandibular, tonsillar, preauricular, posterior auricular or occipital adenopathy.     Cervical: No cervical adenopathy.     Right cervical: No superficial, deep or posterior cervical adenopathy.    Left cervical: No superficial, deep or posterior cervical adenopathy.     Upper Body:     Right upper body: No supraclavicular adenopathy.     Left upper body: No supraclavicular adenopathy.  Skin:    General: Skin is warm and dry.   Neurological:     Mental Status: She is alert.  Psychiatric:        Speech: Speech normal.        Behavior: Behavior normal.        Thought Content: Thought content normal.    BP 112/74 (BP Location: Left Arm, Patient Position: Sitting, Cuff Size: Normal)    Pulse 84    Temp 98.2 F (36.8 C) (Oral)    Ht 5' 5.5" (1.664 m)    Wt 130 lb 6.4 oz (59.1 kg)  SpO2 98%    BMI 21.37 kg/m  Wt Readings from Last 3 Encounters:  06/16/21 130 lb 6.4 oz (59.1 kg)  06/12/20 134 lb (60.8 kg)  01/21/20 140 lb (63.5 kg)    Health Maintenance Due  Topic Date Due   Hepatitis C Screening  Never done   PAP SMEAR-Modifier  Never done    There are no preventive care reminders to display for this patient.   No results found for: TSH Lab Results  Component Value Date   WBC 15.3 (H) 04/02/2017   HGB 11.8 (L) 04/02/2017   HCT 34.4 (L) 04/02/2017   MCV 91.5 04/02/2017   PLT 240 04/02/2017   No results found for: NA, K, CHLORIDE, CO2, GLUCOSE, BUN, CREATININE, BILITOT, ALKPHOS, AST, ALT, PROT, ALBUMIN, CALCIUM, ANIONGAP, EGFR, GFR No results found for: CHOL No results found for: HDL No results found for: LDLCALC No results found for: TRIG No results found for: CHOLHDL No results found for: HGBA1C     Assessment & Plan:   Problem List Items Addressed This Visit   None Visit Diagnoses     Family history of thyroid cancer    -  Primary   Multiple thyroid nodules       Relevant Orders   TSH   US THYROID   Lymphadenopathy       Relevant Medications   amoxicillin-clavulanate (AUGMENTIN) 875-125 MG tablet   Other Relevant Orders   CBC with Differential/Platelet   Comprehensive metabolic panel   US SOFT TISSUE HEAD & NECK (NON-THYROID)   Non-recurrent acute serous otitis media of right ear       Relevant Medications   amoxicillin-clavulanate (AUGMENTIN) 875-125 MG tablet       Orders Placed This Encounter  Procedures   US THYROID    Asap    Scheduling Instructions:     Do with  soft tissue if able.    Order Specific Question:   Reason for Exam (SYMPTOM  OR DIAGNOSIS REQUIRED)    Answer:   tiny thyroid nodules on left and right of thyroid,    Order Specific Question:   Preferred imaging location?    Answer:   [facility]   US SOFT TISSUE HEAD & NECK (NON-THYROID)    Order Specific Question:   Reason for Exam (SYMPTOM  OR DIAGNOSIS REQUIRED)    Answer:   lymph node swollen right submandibular    Order Specific Question:   Preferred imaging location?    Answer:   [facility]   TSH   CBC with Differential/Platelet   Comprehensive metabolic panel      Meds ordered this encounter  Medications   amoxicillin-clavulanate (AUGMENTIN) 875-125 MG tablet    Sig: Take 1 tablet by mouth 2 (two) times daily.    Dispense:  20 tablet    Refill:  0    Red Flags discussed. The patient was given clear instructions to go to ER or return to medical center if any red flags develop, symptoms do not improve, worsen or new problems develop. They verbalized understanding.  Return in about 2 weeks (around 06/30/2021), or if symptoms worsen or fail to improve, for at any time for any worsening symptoms.   Marcille Buffy, FNP

## 2021-06-17 ENCOUNTER — Other Ambulatory Visit: Payer: Self-pay | Admitting: Adult Health

## 2021-06-17 ENCOUNTER — Telehealth: Payer: Self-pay | Admitting: Adult Health

## 2021-06-17 ENCOUNTER — Telehealth: Payer: Self-pay

## 2021-06-17 DIAGNOSIS — E042 Nontoxic multinodular goiter: Secondary | ICD-10-CM

## 2021-06-17 DIAGNOSIS — R591 Generalized enlarged lymph nodes: Secondary | ICD-10-CM

## 2021-06-17 LAB — COMPREHENSIVE METABOLIC PANEL
ALT: 19 U/L (ref 0–35)
AST: 19 U/L (ref 0–37)
Albumin: 4.3 g/dL (ref 3.5–5.2)
Alkaline Phosphatase: 50 U/L (ref 39–117)
BUN: 21 mg/dL (ref 6–23)
CO2: 28 mEq/L (ref 19–32)
Calcium: 9.3 mg/dL (ref 8.4–10.5)
Chloride: 104 mEq/L (ref 96–112)
Creatinine, Ser: 0.95 mg/dL (ref 0.40–1.20)
GFR: 76.57 mL/min (ref >60.00)
Glucose, Bld: 59 mg/dL — ABNORMAL LOW (ref 70–99)
Potassium: 4.2 mEq/L (ref 3.5–5.1)
Sodium: 140 mEq/L (ref 135–145)
Total Bilirubin: 0.3 mg/dL (ref 0.2–1.2)
Total Protein: 7.1 g/dL (ref 6.0–8.3)

## 2021-06-17 LAB — CBC WITH DIFFERENTIAL/PLATELET
Basophils Absolute: 0.1 10*3/uL (ref 0.0–0.1)
Basophils Relative: 1 % (ref 0.0–3.0)
Eosinophils Absolute: 0.2 10*3/uL (ref 0.0–0.7)
Eosinophils Relative: 1.8 % (ref 0.0–5.0)
HCT: 43.6 % (ref 36.0–46.0)
Hemoglobin: 14.7 g/dL (ref 12.0–15.0)
Lymphocytes Relative: 35.3 % (ref 12.0–46.0)
Lymphs Abs: 3.1 10*3/uL (ref 0.7–4.0)
MCHC: 33.8 g/dL (ref 30.0–36.0)
MCV: 88.6 fl (ref 78.0–100.0)
Monocytes Absolute: 0.7 10*3/uL (ref 0.1–1.0)
Monocytes Relative: 8 % (ref 3.0–12.0)
Neutro Abs: 4.7 10*3/uL (ref 1.4–7.7)
Neutrophils Relative %: 53.9 % (ref 43.0–77.0)
Platelets: 404 10*3/uL — ABNORMAL HIGH (ref 150.0–400.0)
RBC: 4.91 Mil/uL (ref 3.87–5.11)
RDW: 13.1 % (ref 11.5–15.5)
WBC: 8.7 10*3/uL (ref 4.0–10.5)

## 2021-06-17 LAB — TSH: TSH: 1.77 u[IU]/mL (ref 0.35–5.50)

## 2021-06-17 NOTE — Telephone Encounter (Signed)
Lvm for pt to return call in regards to labs.  ?

## 2021-06-17 NOTE — Telephone Encounter (Signed)
Pt returning call

## 2021-06-17 NOTE — Progress Notes (Signed)
Patient notified and voiced understanding.

## 2021-06-17 NOTE — Telephone Encounter (Signed)
Lft pt vm to call ofc to sch US thyroid. thanks 

## 2021-06-17 NOTE — Progress Notes (Signed)
CBC with transient elevation in platelets, could likely be due to her being sick recently.  Recheck CBC in 1 month.  CMP is ok, glucose is 59, she  should be sure to eat small meals frequently to avoid hypoglycemia.  TSH for thyroid level is within normal.  Still needs TOC appointment as well- seen for acute viist on 06/16/21.

## 2021-06-20 ENCOUNTER — Telehealth: Payer: Self-pay | Admitting: Adult Health

## 2021-06-20 ENCOUNTER — Ambulatory Visit
Admission: RE | Admit: 2021-06-20 | Discharge: 2021-06-20 | Disposition: A | Discharge status: Home / Self Care | Payer: Commercial Managed Care - PPO | Source: Ambulatory Visit | Attending: Adult Health | Admitting: Adult Health

## 2021-06-20 ENCOUNTER — Other Ambulatory Visit: Payer: Self-pay

## 2021-06-20 DIAGNOSIS — E042 Nontoxic multinodular goiter: Secondary | ICD-10-CM | POA: Diagnosis present

## 2021-06-20 DIAGNOSIS — R591 Generalized enlarged lymph nodes: Secondary | ICD-10-CM | POA: Diagnosis present

## 2021-06-20 NOTE — Progress Notes (Signed)
Normal appearing lymph nodes in neck and at area of concern. Completed antibiotics and keep follow up.

## 2021-06-20 NOTE — Telephone Encounter (Signed)
Anderson Malta from [facility] ultrasound has a report ready to view in epic  [phone]

## 2021-06-21 NOTE — Telephone Encounter (Signed)
Informed pt in regards to [place] results.

## 2021-06-23 ENCOUNTER — Other Ambulatory Visit: Payer: Self-pay

## 2021-06-23 ENCOUNTER — Ambulatory Visit (INDEPENDENT_AMBULATORY_CARE_PROVIDER_SITE_OTHER): Payer: Commercial Managed Care - PPO | Admitting: Adult Health

## 2021-06-23 ENCOUNTER — Encounter: Payer: Self-pay | Admitting: Adult Health

## 2021-06-23 VITALS — BP 110/76 | HR 62 | Temp 98.2°F | Resp 16 | Ht 65.5 in | Wt 132.8 lb

## 2021-06-23 DIAGNOSIS — Z13220 Encounter for screening for lipoid disorders: Secondary | ICD-10-CM

## 2021-06-23 DIAGNOSIS — Z1389 Encounter for screening for other disorder: Secondary | ICD-10-CM

## 2021-06-23 DIAGNOSIS — E162 Hypoglycemia, unspecified: Secondary | ICD-10-CM

## 2021-06-23 DIAGNOSIS — E559 Vitamin D deficiency, unspecified: Secondary | ICD-10-CM

## 2021-06-23 DIAGNOSIS — R591 Generalized enlarged lymph nodes: Secondary | ICD-10-CM

## 2021-06-23 DIAGNOSIS — R5383 Other fatigue: Secondary | ICD-10-CM

## 2021-06-23 MED ORDER — PREDNISONE 10 MG (21) PO TBPK: ORAL_TABLET | ORAL | 0 refills | Status: DC

## 2021-06-23 MED ORDER — BLOOD GLUCOSE METER KIT: PACK | 0 refills | Status: AC

## 2021-06-23 MED ORDER — VITAMIN D (ERGOCALCIFEROL) 1.25 MG (50000 UNIT) PO CAPS: 50000.0000 [IU] | ORAL_CAPSULE | ORAL | 0 refills | Status: DC

## 2021-06-23 NOTE — Progress Notes (Signed)
Acute Office Visit  Subjective:    Patient ID: Amanda [name], female    DOB: 1985-07-21, 36 y.o.   MRN: [id number]  Chief Complaint  Patient presents with   Follow-up    Discuss [place] results.     HPI Patient is in today for feeling some better. Has 3 more  days Augmentin for treatment and lymphadenopathy and URI symptoms she had present since around christmas 2022.  She had runny nose and nasal congestion and her kids were sick at that time.   History of MVP since child, she has had followed through her pregnancies. She was told that ECHO was normal after last pregnancy. She does not want any other work up at this time.   Gynecology she sees every year.  She was in [employer] 13 years.   Patient  denies any fever, body aches,chills, rash, chest pain, shortness of breath, nausea, vomiting, or diarrhea.  Denies dizziness, lightheadedness, pre syncopal or syncopal episodes.     Ultrasound of soft tissue and thyroid US were performed.   EXAM: THYROID ULTRASOUND   TECHNIQUE: Ultrasound examination of the thyroid gland and adjacent soft tissues was performed.   COMPARISON:  None.   FINDINGS: Parenchymal Echotexture: Normal   Isthmus: 0.3 cm   Right lobe: 4.1 x 1.3 x 1.3 cm   Left lobe: 3.9 x 1.1 x 1.4 cm   _________________________________________________________   Estimated total number of nodules >/= 1 cm: 0   Number of spongiform nodules >/=  2 cm not described below (TR1): 0   Number of mixed cystic and solid nodules >/= 1.5 cm not described below (TR2): 0   _________________________________________________________   No discrete nodules are seen within the thyroid gland.   Palpable area in the right neck was evaluated. There is hypoechoic lymph node at this area that measures 0.6 cm in the short axis. This lymph node appears to have normal morphology. Prominent normal appearing lymph node on the left side of the neck measures 0.5 cm in the short axis.    IMPRESSION: 1. Normal thyroid exam.  No thyroid nodules. 2. Normal appearing lymph nodes on both sides of the neck.     Electronically Signed   By: Markus Daft M.D.   On: 06/20/2021 10:19 Past Medical History:  Diagnosis Date   Abnormal Pap smear    Endometriosis    Heart murmur    Miscarriage    MVP (mitral valve prolapse)     Past Surgical History:  Procedure Laterality Date   CERVICAL BIOPSY  W/ LOOP ELECTRODE EXCISION  2004   CERVICAL BIOPSY  W/ LOOP ELECTRODE EXCISION     college   WISDOM TOOTH EXTRACTION      Family History  Problem Relation Age of Onset   Hypertension Mother        hyperlipidemia   Obesity Mother    Heart disease Father    Alcohol abuse Father    Asthma Sister    Obesity Sister    Cancer Maternal Uncle    Breast cancer Maternal Grandmother    Hyperlipidemia Maternal Grandmother    Cancer Maternal Grandmother    Hypertension Maternal Grandfather    Hyperlipidemia Maternal Grandfather    Diabetes Maternal Grandfather    Diabetes Paternal Grandfather    Hypertension Paternal Grandfather    Heart disease Paternal Grandfather        CABGx3   Heart attack Paternal Grandfather     Social History   Socioeconomic History  Marital status: Married    Spouse name: [name]   Number of children: 3   Years of education: college   Highest education level: Bachelor's degree (e.g., BA, AB, BS)  Occupational History   Not on file  Tobacco Use   Smoking status: Never   Smokeless tobacco: Never  Vaping Use   Vaping Use: Never used  Substance and Sexual Activity   Alcohol use: No   Drug use: No   Sexual activity: Yes    Birth control/protection: None  Other Topics Concern   Not on file  Social History Narrative   Patient lives at home with her husband ([name]).  Patient has her associates degree. Patient has 3 kids.    Right handed. Caffeine None      Works as Electrical engineer  Strain: Not on [employer] Insecurity: Not on file  Transportation Needs: Not on file  Physical Activity: Not on file  Stress: Not on file  Social Connections: Not on file  Intimate Partner Violence: Not on file    Outpatient Medications Prior to Visit  Medication Sig Dispense Refill   amoxicillin-clavulanate (AUGMENTIN) 875-125 MG tablet Take 1 tablet by mouth 2 (two) times daily. 20 tablet 0   COLLAGEN PO Take by mouth.     MAGNESIUM-CALCIUM-FOLIC ACID PO Take by mouth.     VITAMIN D PO Take by mouth.     No facility-administered medications prior to visit.    No Known Allergies  Review of Systems  Constitutional: Negative.   HENT: Negative.    Respiratory: Negative.    Cardiovascular: Negative.   Gastrointestinal: Negative.   Genitourinary: Negative.   Musculoskeletal: Negative.   Skin: Negative.   Neurological: Negative.   Hematological:  Positive for adenopathy (improved.).      Objective:    Physical Exam  General: Appearance:    Well developed, well nourished female in no acute distress  Eyes:    PERRL, conjunctiva/corneas clear, EOM's intact       Lungs:     Clear to auscultation bilaterally, respirations unlabored  Heart:    Normal heart rate. Normal rhythm. No murmurs, rubs, or gallops.    MS:   All extremities are intact.    Neurologic:   Awake, alert, oriented x 3. No apparent focal neurological           defect.     BP 110/76 (BP Location: Left Arm, Patient Position: Sitting, Cuff Size: Small)    Pulse 62    Temp 98.2 F (36.8 C) (Oral)    Resp 16    Ht 5' 5.5" (1.664 m)    Wt 132 lb 12.8 oz (60.2 kg)    SpO2 98%    BMI 21.76 kg/m  Wt Readings from Last 3 Encounters:  06/23/21 132 lb 12.8 oz (60.2 kg)  06/16/21 130 lb 6.4 oz (59.1 kg)  06/12/20 134 lb (60.8 kg)    Health Maintenance Due  Topic Date Due   COVID-19 Vaccine (1) Never done   Hepatitis C Screening  Never done   PAP SMEAR-Modifier  Never done    There are no preventive care  reminders to display for this patient.   Lab Results  Component Value Date   TSH 1.77 06/16/2021   Lab Results  Component Value Date   WBC 8.7 06/16/2021   HGB 14.7 06/16/2021   HCT 43.6 06/16/2021   MCV 88.6 06/16/2021  PLT 404.0 (H) 06/16/2021   Lab Results  Component Value Date   NA 140 06/16/2021   K 4.2 06/16/2021   CO2 28 06/16/2021   GLUCOSE 59 (L) 06/16/2021   BUN 21 06/16/2021   CREATININE 0.95 06/16/2021   BILITOT 0.3 06/16/2021   ALKPHOS 50 06/16/2021   AST 19 06/16/2021   ALT 19 06/16/2021   PROT 7.1 06/16/2021   ALBUMIN 4.3 06/16/2021   CALCIUM 9.3 06/16/2021   GFR 76.57 06/16/2021   No results found for: CHOL No results found for: HDL No results found for: LDLCALC No results found for: TRIG No results found for: CHOLHDL No results found for: HGBA1C     Assessment & Plan:   Problem List Items Addressed This Visit       Other   Vitamin D deficiency   Relevant Medications   Vitamin D, Ergocalciferol, (DRISDOL) 1.25 MG (50000 UNIT) CAPS capsule   Other Relevant Orders   VITAMIN D 25 Hydroxy (Vit-D Deficiency, Fractures)   Screening for blood or protein in urine   Relevant Orders   Urinalysis, Routine w reflex microscopic   Other Visit Diagnoses     Other fatigue    -  Primary   Relevant Orders   CBC with Differential/Platelet   Parathyroid hormone, intact (no Ca)   B12 and Folate Panel   Screening cholesterol level       Relevant Orders   Lipid panel   Lymphadenopathy       Relevant Medications   predniSONE (STERAPRED UNI-PAK 21 TAB) 10 MG (21) TBPK tablet   Low blood sugar       Relevant Medications   blood glucose meter kit and supplies   Other Relevant Orders   Hemoglobin A1c       Future labs ordered was seen for acute visit . Need to have establish primary care visit.  Will complete prednisone and Augmentin.   Meds ordered this encounter  Medications   predniSONE (STERAPRED UNI-PAK 21 TAB) 10 MG (21) TBPK tablet     Sig: PO: Take 6 tablets on day 1:Take 5 tablets day 2:Take 4 tablets day 3: Take 3 tablets day 4:Take 2 tablets day five: Take 1 tablet day 6    Dispense:  21 tablet    Refill:  0   blood glucose meter kit and supplies    Sig: Dispense based on patient and insurance preference. Use up to four times daily as directed. (FOR ICD-10 E10.9, E11.9).    Dispense:  1 each    Refill:  0    Order Specific Question:   Number of strips    Answer:   180    Order Specific Question:   Number of lancets    Answer:   180   Vitamin D, Ergocalciferol, (DRISDOL) 1.25 MG (50000 UNIT) CAPS capsule    Sig: Take 1 capsule (50,000 Units total) by mouth every 7 (seven) days. (taking one tablet per week) walk in lab in office 1-2 weeks after completing prescription.    Dispense:  12 capsule    Refill:  0    Red Flags discussed. The patient was given clear instructions to go to ER or return to medical center if any red flags develop, symptoms do not improve, worsen or new problems develop. They verbalized understanding.  Return if symptoms worsen or fail to improve, for at any time for any worsening symptoms, Go to Emergency room/ urgent care if worse.   [place] Hurley Blevins,  FNP

## 2021-06-23 NOTE — Patient Instructions (Signed)
Fatigue °If you have fatigue, you feel tired all the time and have a lack of energy or a lack of motivation. Fatigue may make it difficult to start or complete tasks because of exhaustion. In general, occasional or mild fatigue is often a normal response to activity or life. However, long-lasting (chronic) or extreme fatigue may be a symptom of a medical condition. °Follow these instructions at home: °General instructions °Watch your fatigue for any changes. °Go to bed and get up at the same time every day. °Avoid fatigue by pacing yourself during the day and getting enough sleep at night. °Maintain a healthy weight. °Medicines °Take over-the-counter and prescription medicines only as told by your health care provider. °Take a multivitamin, if told by your health care provider.  °Do not use herbal or dietary supplements unless they are approved by your health care provider. °Activity ° °Exercise regularly, as told by your health care provider. °Use or practice techniques to help you relax, such as yoga, tai chi, meditation, or massage therapy. °Eating and drinking ° °Avoid heavy meals in the evening. °Eat a well-balanced diet, which includes lean proteins, whole grains, plenty of fruits and vegetables, and low-fat dairy products. °Avoid consuming too much caffeine. °Avoid the use of alcohol. °Drink enough fluid to keep your urine pale yellow. °Lifestyle °Change situations that cause you stress. Try to keep your work and personal schedule in balance. °Do not use any products that contain nicotine or tobacco, such as cigarettes and e-cigarettes. If you need help quitting, ask your health care provider. °Do not use drugs. °Contact a health care provider if: °Your fatigue does not get better. °You have a fever. °You suddenly lose or gain weight. °You have headaches. °You have trouble falling asleep or sleeping through the night. °You feel angry, guilty, anxious, or sad. °You are unable to have a bowel movement  (constipation). °Your skin is dry. °You have swelling in your legs or another part of your body. °Get help right away if: °You feel confused. °Your vision is blurry. °You feel faint or you pass out. °You have a severe headache. °You have severe pain in your abdomen, your back, or the area between your waist and hips (pelvis). °You have chest pain, shortness of breath, or an irregular or fast heartbeat. °You are unable to urinate, or you urinate less than normal. °You have abnormal bleeding, such as bleeding from the rectum, vagina, nose, lungs, or nipples. °You vomit blood. °You have thoughts about hurting yourself or others. °If you ever feel like you may hurt yourself or others, or have thoughts about taking your own life, get help right away. You can go to your nearest emergency department or call: °Your local emergency services (911 in the [place] °A suicide crisis helpline, such as the National Suicide Prevention Lifeline at 1-800-273-8255 or 988 in the [place] This is open 24 hours a day. °Summary °If you have fatigue, you feel tired all the time and have a lack of energy or a lack of motivation. °Fatigue may make it difficult to start or complete tasks because of exhaustion. °Long-lasting (chronic) or extreme fatigue may be a symptom of a medical condition. °Exercise regularly, as told by your health care provider. °Change situations that cause you stress. Try to keep your work and personal schedule in balance. °This information is not intended to replace advice given to you by your health care provider. Make sure you discuss any questions you have with your health care provider. °Document Revised:   12/01/2020 Document Reviewed: 03/18/2020 °Elsevier Patient Education © 2022 Elsevier Inc. ° °

## 2021-06-26 ENCOUNTER — Encounter: Payer: Self-pay | Admitting: Adult Health

## 2021-08-02 ENCOUNTER — Other Ambulatory Visit (INDEPENDENT_AMBULATORY_CARE_PROVIDER_SITE_OTHER): Payer: Commercial Managed Care - PPO

## 2021-08-02 ENCOUNTER — Other Ambulatory Visit: Payer: Self-pay

## 2021-08-02 DIAGNOSIS — E559 Vitamin D deficiency, unspecified: Secondary | ICD-10-CM | POA: Diagnosis not present

## 2021-08-02 DIAGNOSIS — Z13220 Encounter for screening for lipoid disorders: Secondary | ICD-10-CM | POA: Diagnosis not present

## 2021-08-02 DIAGNOSIS — R5383 Other fatigue: Secondary | ICD-10-CM | POA: Diagnosis not present

## 2021-08-02 DIAGNOSIS — Z1389 Encounter for screening for other disorder: Secondary | ICD-10-CM | POA: Diagnosis not present

## 2021-08-02 DIAGNOSIS — E162 Hypoglycemia, unspecified: Secondary | ICD-10-CM

## 2021-08-02 LAB — CBC WITH DIFFERENTIAL/PLATELET
Basophils Absolute: 0.1 10*3/uL (ref 0.0–0.1)
Basophils Relative: 1 % (ref 0.0–3.0)
Eosinophils Absolute: 0.1 10*3/uL (ref 0.0–0.7)
Eosinophils Relative: 1.8 % (ref 0.0–5.0)
HCT: 42.2 % (ref 36.0–46.0)
Hemoglobin: 14.3 g/dL (ref 12.0–15.0)
Lymphocytes Relative: 34.4 % (ref 12.0–46.0)
Lymphs Abs: 2.1 10*3/uL (ref 0.7–4.0)
MCHC: 33.9 g/dL (ref 30.0–36.0)
MCV: 89.5 fl (ref 78.0–100.0)
Monocytes Absolute: 0.4 10*3/uL (ref 0.1–1.0)
Monocytes Relative: 6 % (ref 3.0–12.0)
Neutro Abs: 3.4 10*3/uL (ref 1.4–7.7)
Neutrophils Relative %: 56.8 % (ref 43.0–77.0)
Platelets: 307 10*3/uL (ref 150.0–400.0)
RBC: 4.71 Mil/uL (ref 3.87–5.11)
RDW: 13.5 % (ref 11.5–15.5)
WBC: 6 10*3/uL (ref 4.0–10.5)

## 2021-08-02 LAB — LIPID PANEL
Cholesterol: 151 mg/dL (ref 0–200)
HDL: 69.8 mg/dL (ref >39.00)
LDL Cholesterol: 59 mg/dL (ref 0–99)
NonHDL: 80.85
Total CHOL/HDL Ratio: 2
Triglycerides: 110 mg/dL (ref 0.0–149.0)
VLDL: 22 mg/dL (ref 0.0–40.0)

## 2021-08-02 LAB — URINALYSIS, ROUTINE W REFLEX MICROSCOPIC
Bilirubin Urine: NEGATIVE
Hgb urine dipstick: NEGATIVE
Ketones, ur: NEGATIVE
Leukocytes,Ua: NEGATIVE
Nitrite: NEGATIVE
RBC / HPF: NONE SEEN (ref 0–?)
Specific Gravity, Urine: 1.02 (ref 1.000–1.030)
Total Protein, Urine: NEGATIVE
Urine Glucose: NEGATIVE
Urobilinogen, UA: 0.2 (ref 0.0–1.0)
pH: 6 (ref 5.0–8.0)

## 2021-08-02 LAB — HEMOGLOBIN A1C: Hgb A1c MFr Bld: 5.4 % (ref 4.6–6.5)

## 2021-08-02 LAB — B12 AND FOLATE PANEL
Folate: >24.2 ng/mL (ref >5.9)
Vitamin B-12: 594 pg/mL (ref 211–911)

## 2021-08-02 LAB — VITAMIN D 25 HYDROXY (VIT D DEFICIENCY, FRACTURES): VITD: 78.36 ng/mL (ref 30.00–100.00)

## 2021-08-03 ENCOUNTER — Telehealth: Payer: Self-pay | Admitting: Adult Health

## 2021-08-03 LAB — PARATHYROID HORMONE, INTACT (NO CA): PTH: 39 pg/mL (ref 16–77)

## 2021-08-03 NOTE — Progress Notes (Signed)
Labs within normal limits.  ?Urinalysis is okay, amorphous cells in urine which sometimes comes from high protein diet no bacteria, would advise to have repeat urinalysis in 1-2 months  with her new PCP since my last day is 08/11/21.

## 2021-08-03 NOTE — Telephone Encounter (Signed)
Pt mom called in requesting a cmp and bmp to be added to her labs. Pt mom stated that she came to get labs done yesterday and she stated she received her results already. Pt requesting callback  ?

## 2021-08-03 NOTE — Progress Notes (Signed)
PTH is within normal limits.

## 2021-08-03 NOTE — Telephone Encounter (Signed)
Pt called looking for cmp to be added so her sugar can be checked. ?I advised no reason to rpt cmp. ?A1C was done instead to check sugar, which was normal.  ?

## 2021-08-05 ENCOUNTER — Ambulatory Visit (INDEPENDENT_AMBULATORY_CARE_PROVIDER_SITE_OTHER): Payer: Commercial Managed Care - PPO | Admitting: Physician Assistant

## 2021-08-05 ENCOUNTER — Ambulatory Visit: Payer: Self-pay

## 2021-08-05 ENCOUNTER — Other Ambulatory Visit: Payer: Self-pay

## 2021-08-05 ENCOUNTER — Encounter: Payer: Self-pay | Admitting: Physician Assistant

## 2021-08-05 VITALS — BP 115/74 | HR 70 | Temp 98.0°F | Ht 65.0 in | Wt 133.5 lb

## 2021-08-05 DIAGNOSIS — E042 Nontoxic multinodular goiter: Secondary | ICD-10-CM

## 2021-08-05 DIAGNOSIS — I341 Nonrheumatic mitral (valve) prolapse: Secondary | ICD-10-CM

## 2021-08-05 DIAGNOSIS — R590 Localized enlarged lymph nodes: Secondary | ICD-10-CM

## 2021-08-05 DIAGNOSIS — M542 Cervicalgia: Secondary | ICD-10-CM | POA: Diagnosis not present

## 2021-08-05 DIAGNOSIS — R591 Generalized enlarged lymph nodes: Secondary | ICD-10-CM

## 2021-08-05 DIAGNOSIS — K589 Irritable bowel syndrome without diarrhea: Secondary | ICD-10-CM
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
## 2021-08-05 NOTE — Telephone Encounter (Signed)
Summary: pain when swallowing  ? Pt has pain when swallowing , slight sharp pain. Pt stated has been dealing with this for a while.  ? ?Pt is scheduled for a new pt appointment [facility] 08/08/2021  ? ?Seeking clinical advice.   ?  ? ?Answer Assessment - Initial Assessment Questions ?1. SYMPTOM: "Are you having difficulty swallowing liquids, solids, or both?" ?    Same - pain  ?2. ONSET: "When did the swallowing problems begin?"  ?    Last month - end of January ?3. CAUSE: "What do you think is causing the problem?"  ?    Unsure - ear infection ?4. CHRONIC/RECURRENT: "Is this a new problem for you?"  If no, ask: "How long have you had this problem?" (e.g., days, weeks, months)  ?    2 months ?5. OTHER SYMPTOMS: "Do you have any other symptoms?" (e.g., difficulty breathing, sore throat, swollen tongue, chest pain) ?    no ?6. PREGNANCY: "Is there any chance you are pregnant?" "When was your last menstrual period?" ?    na ? ?Protocols used: Swallowing Difficulty-A-AH ? ?

## 2021-08-05 NOTE — Telephone Encounter (Signed)
? ?  Chief Complaint: Pain when swallowing - Ear pain ?Symptoms: Right sided throat pain ?Frequency: since January  ?Pertinent Negatives: Patient denies inability to swallo ?Disposition: [] ED /[] Urgent Care (no appt availability in office) / [x] Appointment(In office/virtual)/ []  [place] [facility]/ [] Home Care/ [] Refused Recommended Disposition /[] [place] [facility]/ []  Follow-up with PCP ?Additional Notes: Pt reports ongoing and worsening pain when swallowing. Pt was seen at another clinic, however the provider she was seeing has left the practice. Pt reports that this pain has been with her since January and despite antibiotic and steroids pain has increased. ? ? ? ?Reason for Disposition ? Difficulty swallowing is a chronic symptom (recurrent or ongoing AND present > 4 weeks) ? ?Answer Assessment - Initial Assessment Questions ?1. SYMPTOM: "Are you having difficulty swallowing liquids, solids, or both?" ?    Same - pain  ?2. ONSET: "When did the swallowing problems begin?"  ?    Last month - end of January ?3. CAUSE: "What do you think is causing the problem?"  ?    Unsure - ear infection ?4. CHRONIC/RECURRENT: "Is this a new problem for you?"  If no, ask: "How long have you had this problem?" (e.g., days, weeks, months)  ?    2 months ?5. OTHER SYMPTOMS: "Do you have any other symptoms?" (e.g., difficulty breathing, sore throat, swollen tongue, chest pain) ?    no ?6. PREGNANCY: "Is there any chance you are pregnant?" "When was your last menstrual period?" ?    na ? ?Protocols used: Swallowing Difficulty-A-AH ? ?
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
## 2021-08-08 ENCOUNTER — Ambulatory Visit: Payer: Self-pay | Admitting: Physician Assistant

## 2021-08-08 NOTE — Progress Notes (Signed)
Communicated with patient over the phone: ?Patient reports sleeping problems and worsening of her symptoms after the sleep. She is not sure if she grinding her teeth at night. She was diagnosed with Anxiety a while back. Not taking any medications and states that it is well controlled. ? ?Discussed that she might have TMD and might need to try a few jaw exercises and proper sleep hygiene as well as maintain optimal head position. There are limited high-quality data supporting the use of self-care and education in the treatment of TMD; however, given the lack of harm and the potential benefit, these interventions might be appropriate.  ?The patient was advised to call back or seek an in-person evaluation if the symptoms worsen or if the condition fails to improve as anticipated. ? ?I discussed the assessment and treatment plan with the patient. The patient was provided an opportunity to ask questions and all were answered. The patient agreed with the plan and demonstrated an understanding of the instructions. ? ?Mardene Speak, PAC ?

## 2021-08-10 ENCOUNTER — Ambulatory Visit: Payer: Self-pay | Admitting: *Deleted

## 2021-08-10 ENCOUNTER — Other Ambulatory Visit: Payer: Self-pay

## 2021-08-10 ENCOUNTER — Emergency Department: Payer: Commercial Managed Care - PPO

## 2021-08-10 ENCOUNTER — Encounter: Payer: Self-pay | Admitting: Emergency Medicine

## 2021-08-10 ENCOUNTER — Ambulatory Visit: Payer: Commercial Managed Care - PPO | Admitting: Physician Assistant

## 2021-08-10 ENCOUNTER — Emergency Department
Admission: EM | Admit: 2021-08-10 | Discharge: 2021-08-10 | Disposition: A | Discharge status: Home / Self Care | Payer: Commercial Managed Care - PPO | Attending: Student in an Organized Health Care Education/Training Program | Admitting: Student in an Organized Health Care Education/Training Program

## 2021-08-10 DIAGNOSIS — M542 Cervicalgia: Secondary | ICD-10-CM | POA: Diagnosis not present

## 2021-08-10 DIAGNOSIS — R609 Edema, unspecified: Secondary | ICD-10-CM | POA: Diagnosis not present

## 2021-08-10 DIAGNOSIS — H6990 Unspecified Eustachian tube disorder, unspecified ear: Secondary | ICD-10-CM | POA: Diagnosis not present

## 2021-08-10 DIAGNOSIS — M436 Torticollis: Secondary | ICD-10-CM | POA: Diagnosis present

## 2021-08-10 LAB — CBC WITH DIFFERENTIAL/PLATELET
Abs Immature Granulocytes: 0.03 10*3/uL (ref 0.00–0.07)
Basophils Absolute: 0.1 10*3/uL (ref 0.0–0.1)
Basophils Relative: 1 %
Eosinophils Absolute: 0 10*3/uL (ref 0.0–0.5)
Eosinophils Relative: 0 %
HCT: 43.6 % (ref 36.0–46.0)
Hemoglobin: 14.5 g/dL (ref 12.0–15.0)
Immature Granulocytes: 0 %
Lymphocytes Relative: 19 %
Lymphs Abs: 1.5 10*3/uL (ref 0.7–4.0)
MCH: 29.4 pg (ref 26.0–34.0)
MCHC: 33.3 g/dL (ref 30.0–36.0)
MCV: 88.4 fL (ref 80.0–100.0)
Monocytes Absolute: 0.5 10*3/uL (ref 0.1–1.0)
Monocytes Relative: 6 %
Neutro Abs: 5.9 10*3/uL (ref 1.7–7.7)
Neutrophils Relative %: 74 %
Platelets: 327 10*3/uL (ref 150–400)
RBC: 4.93 MIL/uL (ref 3.87–5.11)
RDW: 12.9 % (ref 11.5–15.5)
WBC: 8.1 10*3/uL (ref 4.0–10.5)
nRBC: 0 % (ref 0.0–0.2)

## 2021-08-10 LAB — COMPREHENSIVE METABOLIC PANEL
ALT: 21 U/L (ref 0–44)
AST: 22 U/L (ref 15–41)
Albumin: 3.9 g/dL (ref 3.5–5.0)
Alkaline Phosphatase: 47 U/L (ref 38–126)
Anion gap: 6 (ref 5–15)
BUN: 14 mg/dL (ref 6–20)
CO2: 26 mmol/L (ref 22–32)
Calcium: 8.8 mg/dL — ABNORMAL LOW (ref 8.9–10.3)
Chloride: 106 mmol/L (ref 98–111)
Creatinine, Ser: 0.75 mg/dL (ref 0.44–1.00)
GFR, Estimated: >60 mL/min (ref >60)
Glucose, Bld: 99 mg/dL (ref 70–99)
Potassium: 3.9 mmol/L (ref 3.5–5.1)
Sodium: 138 mmol/L (ref 135–145)
Total Bilirubin: 0.6 mg/dL (ref 0.3–1.2)
Total Protein: 6.8 g/dL (ref 6.5–8.1)

## 2021-08-10 MED ORDER — LIDOCAINE VISCOUS HCL 2 % MT SOLN: 10.0000 mL | OROMUCOSAL | 0 refills | Status: DC | PRN

## 2021-08-10 MED ORDER — IOHEXOL 300 MG/ML  SOLN
75.0000 mL | Freq: Once | INTRAMUSCULAR | Status: AC | PRN
  Administered 2021-08-10: 75 mL via INTRAVENOUS
  Filled 2021-08-10: qty 75

## 2021-08-10 MED ORDER — PREDNISONE 10 MG PO TABS: ORAL_TABLET | ORAL | 0 refills | Status: DC

## 2021-08-10 NOTE — Telephone Encounter (Signed)
?  Chief Complaint: Left ear pressure ?Symptoms: Feels like talking into a tin can on the inside.   Pressure, no pain.   Right ear with same issue with pain with swallowing.   Seen recently for it now left side affected. ?Frequency: Yesterday morning left ear pressure began ?Pertinent Negatives: Patient denies ear pain.  More a pressure and discomfort. ?Disposition: [] ED /[] Urgent Care (no appt availability in office) / [x] Appointment(In office/virtual)/ []  [facility]/ [] Home Care/ [] Refused Recommended Disposition /[] [facility]/ []  Follow-up with PCP ?Additional Notes: Appt made for today with , PA-C  ?

## 2021-08-10 NOTE — ED Provider Notes (Signed)
? ?[facility] ?Provider Note ? ? ? Event Date/Time  ? First MD Initiated Contact with Patient 08/10/21 1119   ?  (approximate) ? ? ?History  ? ?Torticollis ? ? ?HPI ? ?Amanda [name] is a 36 y.o. female   presents to the ED via EMS with complaint of right-sided neck pain that started approximately 3 weeks ago without history of injury.  Patient states that this morning it was more severe than it has been in the past.  She reports that pain is worse with swallowing.  She has seen her PCP and was started on Augmentin and steroids for lymphadenopathy.  Patient denies any fever or chills.  She denies any upper respiratory symptoms or cough.  Patient is a non-smoker and reports alcohol use maybe once a year.  Patient has a history of mitral valve prolapse, endometriosis, irritable bowel syndrome.  She rates her pain as a 9 out of 10. ? ?  ? ? ?Physical Exam  ? ?Triage Vital Signs: ?ED Triage Vitals  ?Enc Vitals Group  ?   BP 08/10/21 1115 139/86  ?   Pulse Rate 08/10/21 1115 93  ?   Resp 08/10/21 1115 18  ?   Temp 08/10/21 1115 98.3 ?F (36.8 ?C)  ?   Temp Source 08/10/21 1115 Oral  ?   SpO2 08/10/21 1115 100 %  ?   Weight 08/10/21 1123 133 lb 6.1 oz (60.5 kg)  ?   Height 08/10/21 1123 5\' 5"  (1.651 m)  ?   Head Circumference --   ?   Peak Flow --   ?   Pain Score 08/10/21 1123 9  ?   Pain Loc --   ?   Pain Edu? --   ?   Excl. in GC? --   ? ? ?Most recent vital signs: ?Vitals:  ? 08/10/21 1234 08/10/21 1447  ?BP: 137/89 138/90  ?Pulse: 90 88  ?Resp: 16 16  ?Temp:    ?SpO2: 99% 99%  ? ? ? ?General: Awake, no distress.  Tearful.  Able to answer questions in full sentences. ?CV:  Good peripheral perfusion.  Heart regular rate and rhythm. ?Resp:  Normal effort.  Lungs are clear bilaterally. ?Abd:  No distention.  ?Other:  No obvious deformity noted on inspection of the soft tissue neck.  There is moderate tenderness on palpation of the right lateral aspect.  No obvious masses were palpated.  Range of  motion in all 4 planes is performed without any restrictions but had some discomfort with flexion.  No discoloration and no cervical lymphadenopathy appreciated. ? ? ?ED Results / Procedures / Treatments  ? ?Labs ?(all labs ordered are listed, but only abnormal results are displayed) ?Labs Reviewed  ?COMPREHENSIVE METABOLIC PANEL - Abnormal; Notable for the following components:  ?    Result Value  ? Calcium 8.8 (*)   ? All other components within normal limits  ?CBC WITH DIFFERENTIAL/PLATELET  ?POC URINE PREG, ED  ? ? ? ? ?RADIOLOGY ? ?See ED soft neck with contrast radiology report was reviewed and no lymphadenopathy, masses or acute abnormality noted. ? ? ?PROCEDURES: ? ?Critical Care performed:  ? ?Procedures ? ? ?MEDICATIONS ORDERED IN ED: ?Medications  ?iohexol (OMNIPAQUE) 300 MG/ML solution 75 mL (75 mLs Intravenous Contrast Given 08/10/21 1446)  ? ? ? ?IMPRESSION / MDM / ASSESSMENT AND PLAN / ED COURSE  ?I reviewed the triage vital signs and the nursing notes. ? ? ?Differential diagnosis includes, but is not  limited to, station tube dysfunction, otitis media, cervical lymphadenopathy, soft tissue mass, pharyngitis bacterial/viral. ? ?36 year old female presents to the ED with complaint of pain to the right side of her neck that started approximately 3 weeks ago.  She was seen by her PCP at which time she was given a prescription for Augmentin and prednisone thinking that this was coming from possibly a ear infection.  Patient does not recall getting any relief from this.  Since that time she has developed pain with swallowing but is still drinking and eating without any choking sensation.  Patient was brought to the ED today as she was driving to see her doctor and could not tolerate the pain.  Physical exam was benign.  Labs were reviewed and reassuring with CBC showing a WBC of 8.1, CMP was normal.  CT scan soft tissue neck with contrast did not show any masses,  abnormality or cervical lymphadenopathy.   Patient was made aware and was reassured.  For further evaluation she is being referred to [facility], Dr. Willeen Cass for further evaluation.  Currently she is being treated for eustachian tube dysfunction with a tapering dose of prednisone and also a prescription for Maalox and viscous lidocaine one-to-one ratio for her to use as needed for when she swallows and has pain.  Prior to discharge patient was also given the ENT on-call for [facility] as she is unable to get an appointment with [facility] for 2 weeks. ? ? ? ? ?FINAL CLINICAL IMPRESSION(S) / ED DIAGNOSES  ? ?Final diagnoses:  ?Disorder of Eustachian tube, unspecified laterality  ?Cervical pain (neck)  ? ? ? ?Rx / DC Orders  ? ?ED Discharge Orders   ? ?      Ordered  ?  predniSONE (DELTASONE) 10 MG tablet       ? 08/10/21 1533  ?  lidocaine (XYLOCAINE) 2 % solution  As needed       ?Note to Pharmacy: Mix 1:1 ratio with Maalox  ? 08/10/21 1533  ? ?  ?  ? ?  ? ? ? ?Note:  This document was prepared using Dragon voice recognition software and may include unintentional dictation errors. ?  ?Tommi Rumps, PA-C ?08/10/21 1551 ? ?  ?Willy Eddy, MD ?08/10/21 1552 ? ?

## 2021-08-10 NOTE — Telephone Encounter (Signed)
?  Chief Complaint: called to cancel appt due to difficulty swallowing and called 911. ?Symptoms: righ side neck sharp pain, difficulty swallowing  ?Frequency: na ?Pertinent Negatives: Patient denies difficulty breathing ?Disposition: [x] ED /[] Urgent Care (no appt availability in office) / [] Appointment(In office/virtual)/ []  [facility]/ [] Home Care/ [] Refused Recommended Disposition /[] [place] [facility]/ []  Follow-up with PCP ?Additional Notes:  ? ?Patient called 911 prior to calling clinic to cancel appt. ? ? Called FC, ,notified patient had to call 911 and was calling  to cancel appt today . FC will cancel appt.    ? ? ? ? ?Reason for Disposition ? Sounds like a life-threatening emergency to the triager ? ?Answer Assessment - Initial Assessment Questions ?1. SYMPTOM: "Are you having difficulty swallowing liquids, solids, or both?" ?    Pain swallowing  ?2. ONSET: "When did the swallowing problems begin?"  ?    On going but worse now  ?3. CAUSE: "What do you think is causing the problem?"  ?    na ?4. CHRONIC/RECURRENT: "Is this a new problem for you?"  If no, ask: "How long have you had this problem?" (e.g., days, weeks, months)  ?    na ?5. OTHER SYMPTOMS: "Do you have any other symptoms?" (e.g., difficulty breathing, sore throat, swollen tongue, chest pain) ?    Sharp pain swallowing in right side of neck  ?6. PREGNANCY: "Is there any chance you are pregnant?" "When was your last menstrual period?" ?    na ? ?Protocols used: Swallowing Difficulty-A-AH ? ?

## 2021-08-10 NOTE — ED Triage Notes (Signed)
Presents with right sided neck pain  states pain started about 3 weeks ago w/o injury  states pain became more severe today  ?

## 2021-08-10 NOTE — Telephone Encounter (Signed)
Reason for Disposition ? [1] SEVERE pain AND [2] not improved 2 hours after taking analgesic medication (e.g., ibuprofen or acetaminophen) ? ?Answer Assessment - Initial Assessment Questions ?1. LOCATION: "Which ear is involved?" ?    Left ear feels plugged.  When I talk I feel like I'm talking into a can.   The right side is still painful to swallow.  No one can figure out what is going on.   I'm to see an ENT.   They can't see me as sooner than April 6th.    ?I was seen recently for right ear. ?When I sneeze the pressure on right side feels like it will explode.   It's so painful.    ?2. ONSET: "When did the ear start hurting"  ?    Left ear yesterday morning it felt plugged.   ?3. SEVERITY: "How bad is the pain?"  (Scale 1-10; mild, moderate or severe) ?  - MILD (1-3): doesn't interfere with normal activities  ?  - MODERATE (4-7): interferes with normal activities or awakens from sleep  ?  - SEVERE (8-10): excruciating pain, unable to do any normal activities  ?    *No Answer* ?4. URI SYMPTOMS: "Do you have a runny nose or cough?" ?    No sore throat.  ?I feel like my nose has pressure from the ear pressure. ?5. FEVER: "Do you have a fever?" If Yes, ask: "What is your temperature, how was it measured, and when did it start?" ?    No fever ?6. CAUSE: "Have you been swimming recently?", "How often do you use Q-TIPS?", "Have you had any recent air travel or scuba diving?" ?    *No Answer* ?7. OTHER SYMPTOMS: "Do you have any other symptoms?" (e.g., headache, stiff neck, dizziness, vomiting, runny nose, decreased hearing) ?    *No Answer* ?8. PREGNANCY: "Is there any chance you are pregnant?" "When was your last menstrual period?" ?    *No Answer* ? ?Protocols used: Earache-A-AH ? ?

## 2021-08-10 NOTE — Discharge Instructions (Signed)
Call make an appoint with Dr. Willeen Cass who is the specialist on-call for [facility].  In the meantime begin using the lidocaine solution with swishing around her mouth and then swallowing to see if this helps with swallowing and numbers her throat so does not hurt as much.  Also the prednisone is a taper dose beginning with 6 tablets today and tapering down to 1.  Your labs today and CT scan did not show any life-threatening illness and you will still need further evaluation. ?

## 2021-08-10 NOTE — Progress Notes (Deleted)
?  ? ? ?  Established patient visit ? ? ?Patient: Amanda [name]   DOB: 1986-02-05   36 y.o. Female  MRN: [id number] ?Visit Date: 08/10/2021 ? ?Today's healthcare provider: Mardene Speak, PA-C  ? ?No chief complaint on file. ? ?Subjective  ?  ? Pt reports is feels plugged. Onset x 2 days.  ?*** ? ?Medications: ?Outpatient Medications Prior to Visit  ?Medication Sig  ? blood glucose meter kit and supplies Dispense based on patient and insurance preference. Use up to four times daily as directed. (FOR ICD-10 E10.9, E11.9).  ? COLLAGEN PO Take by mouth.  ? MAGNESIUM-CALCIUM-FOLIC ACID PO Take by mouth.  ? VITAMIN D PO Take by mouth.  ? Vitamin D, Ergocalciferol, (DRISDOL) 1.25 MG (50000 UNIT) CAPS capsule Take 1 capsule (50,000 Units total) by mouth every 7 (seven) days. (taking one tablet per week) walk in lab in office 1-2 weeks after completing prescription.  ? ?No facility-administered medications prior to visit.  ? ? ?Review of Systems  ?HENT:  Positive for ear pain.   ? ?{Labs  Heme  Chem  Endocrine  Serology  Results Review (optional):23779} ?  Objective  ?  ?There were no vitals taken for this visit. ?{Show previous vital signs (optional):23777} ? ?Physical Exam  ?*** ? ?No results found for any visits on 08/10/21. ? Assessment & Plan  ?  ? ?*** ? ?No follow-ups on file.  ?   ? ?{provider attestation***:1} ? ? ?Mardene Speak, PA-C  ?[place] ?[phone] (phone) ?[phone] (fax) ? ?[facility] ?

## 2021-08-18 ENCOUNTER — Encounter: Payer: 59 | Admitting: Adult Health

## 2022-04-21 ENCOUNTER — Telehealth: Payer: Self-pay

## 2022-04-21 NOTE — Telephone Encounter (Signed)
Copied from CRM [id number]. Topic: Appointment Scheduling - Prior Auth Required for Appointment >> Apr 21, 2022  3:08 PM Clide Dales wrote: Patient would like to be established with Dr. Roxan Hockey instead of Edmon Crape. Patient would prefer to see an MD over a PA if possible. Please follow up with patient.

## 2022-04-25 NOTE — Telephone Encounter (Signed)
Ok to switch. Please schedule when patient is available.   Ronnald Ramp, MD  [facility]

## 2022-11-02 ENCOUNTER — Encounter: Payer: Self-pay | Admitting: Family Medicine

## 2022-11-02 ENCOUNTER — Ambulatory Visit: Payer: Commercial Managed Care - PPO | Admitting: Family Medicine

## 2022-11-02 VITALS — BP 107/75 | HR 88 | Temp 97.8°F | Resp 12 | Wt 154.0 lb

## 2022-11-02 DIAGNOSIS — R5383 Other fatigue: Secondary | ICD-10-CM | POA: Diagnosis not present

## 2022-11-02 NOTE — Assessment & Plan Note (Signed)
Recurrent chronic problem Patient states that she recently had menses so pregnancy less likely contributing to symptoms Will assess for anemia with CBC We will check for any nutritional abnormalities with vitamin B12 and CMP With report of history of vitamin D deficiency, will check vitamin D levels today History of abnormal thyroid within the family, will check TSH and free T4 given recent onset of fatigue Will also check for hemoglobin A1c

## 2022-11-02 NOTE — Progress Notes (Signed)
I,Sulibeya S Dimas,acting as a Neurosurgeon for [employer], MD.,have documented all relevant documentation on the behalf of Ronnald Ramp, MD,as directed by  Ronnald Ramp, MD while in the presence of Ronnald Ramp, MD.     Established patient visit   Patient: Amanda [name]   DOB: 1986-02-06   37 y.o. Female  MRN: [id number] Visit Date: 11/02/2022  Today's healthcare provider: Ronnald Ramp, MD   Chief Complaint  Patient presents with   Fatigue   Subjective    HPI   Fatigue  She reports  started a few months  fatigue which she describes as a lack of energy, feeling exhausted, and feeling sleepy. It began about a month ago and occurs a few days a week. It is described as mild and staying constant. She has not started new medications around the time the fatigue started.  States that she recently had normal menses.  She does report having some heavier days of menstrual bleeding but otherwise.  Was normal.  She notes that there are normal day-to-day stresses in her personal life and that she takes care of 3 kids and husband.  She denies any neck tenderness near the thyroid gland.  She notes that her mother has history of thyroid cancer.  Associated symptoms: No arthralgias No bleeding  No melena No chest discomfort  No heart palpitations No heart racing   No dyspnea No feeling depressed  Yes feeling anxious or under stress No fevers  No loss of appetite No nausea  No vomiting No sleeping problems    Wt Readings from Last 3 Encounters:  11/02/22 154 lb (69.9 kg)  08/10/21 133 lb 6.1 oz (60.5 kg)  08/05/21 133 lb 8 oz (60.6 kg)    Lab Results  Component Value Date   WBC 8.1 08/10/2021   HGB 14.5 08/10/2021   HCT 43.6 08/10/2021   MCV 88.4 08/10/2021   PLT 327 08/10/2021   Lab Results  Component Value Date   TSH 1.77 06/16/2021   Lab Results  Component Value Date   NA 138 08/10/2021   K 3.9 08/10/2021   CO2 26  08/10/2021   BUN 14 08/10/2021   CREATININE 0.75 08/10/2021   CALCIUM 8.8 (L) 08/10/2021   GLUCOSE 99 08/10/2021     ---------------------------------------------------------------------------------------------------   Medications: Outpatient Medications Prior to Visit  Medication Sig   blood glucose meter kit and supplies Dispense based on patient and insurance preference. Use up to four times daily as directed. (FOR ICD-10 E10.9, E11.9).   COLLAGEN PO Take by mouth.   MAGNESIUM-CALCIUM-FOLIC ACID PO Take by mouth.   VITAMIN D PO Take by mouth.   Vitamin D, Ergocalciferol, (DRISDOL) 1.25 MG (50000 UNIT) CAPS capsule Take 1 capsule (50,000 Units total) by mouth every 7 (seven) days. (taking one tablet per week) walk in lab in office 1-2 weeks after completing prescription.   [DISCONTINUED] lidocaine (XYLOCAINE) 2 % solution Use as directed 10 mLs in the mouth or throat as needed (throat pain). Swish and swallow   [DISCONTINUED] predniSONE (DELTASONE) 10 MG tablet Take 6 tablets  today, on day 2 take 5 tablets, day 3 take 4 tablets, day 4 take 3 tablets, day 5 take  2 tablets and 1 tablet the last day   No facility-administered medications prior to visit.    Review of Systems  Constitutional:  Positive for activity change and fatigue. Negative for appetite change, chills and fever.  Respiratory:  Negative for cough, chest tightness and shortness of breath.  Cardiovascular:  Negative for chest pain and palpitations.  Gastrointestinal:  Negative for abdominal pain, blood in stool, nausea and vomiting.  Musculoskeletal:  Negative for arthralgias.  Psychiatric/Behavioral:  Negative for sleep disturbance.        Objective    BP 107/75 (BP Location: Left Arm, Patient Position: Sitting, Cuff Size: Normal)   Pulse 88   Temp 97.8 F (36.6 C) (Temporal)   Resp 12   Wt 154 lb (69.9 kg)   LMP 10/28/2022 (Exact Date)   BMI 25.63 kg/m  BP Readings from Last 3 Encounters:  11/02/22  107/75  08/10/21 138/90  08/05/21 115/74   Wt Readings from Last 3 Encounters:  11/02/22 154 lb (69.9 kg)  08/10/21 133 lb 6.1 oz (60.5 kg)  08/05/21 133 lb 8 oz (60.6 kg)      Physical Exam Vitals reviewed.  Constitutional:      General: She is not in acute distress.    Appearance: Normal appearance. She is not ill-appearing, toxic-appearing or diaphoretic.  Eyes:     Conjunctiva/sclera: Conjunctivae normal.  Neck:     Thyroid: No thyroid mass, thyromegaly or thyroid tenderness.  Cardiovascular:     Rate and Rhythm: Normal rate and regular rhythm.     Pulses: Normal pulses.     Heart sounds: Normal heart sounds. No murmur heard.    No friction rub. No gallop.  Pulmonary:     Effort: Pulmonary effort is normal. No respiratory distress.     Breath sounds: Normal breath sounds. No stridor. No wheezing, rhonchi or rales.  Abdominal:     General: Bowel sounds are normal. There is no distension.     Palpations: Abdomen is soft.     Tenderness: There is no abdominal tenderness.  Musculoskeletal:     Right lower leg: No edema.     Left lower leg: No edema.  Skin:    Findings: No erythema or rash.  Neurological:     Mental Status: She is alert and oriented to person, place, and time.       No results found for any visits on 11/02/22.  Assessment & Plan     Problem List Items Addressed This Visit       Other   Other fatigue - Primary    Recurrent chronic problem Patient states that she recently had menses so pregnancy less likely contributing to symptoms Will assess for anemia with CBC We will check for any nutritional abnormalities with vitamin B12 and CMP With report of history of vitamin D deficiency, will check vitamin D levels today History of abnormal thyroid within the family, will check TSH and free T4 given recent onset of fatigue Will also check for hemoglobin A1c        Relevant Orders   Comprehensive metabolic panel   TSH + free T4   Vitamin D (25  hydroxy)   Vitamin B12   CBC   Hemoglobin A1c     Return in about 1 month (around 12/02/2022) for fatigue .       The entirety of the information documented in the History of Present Illness, Review of Systems and Physical Exam were personally obtained by me. Portions of this information were initially documented by [facility]. I, Ronnald Ramp, MD have reviewed the documentation above for thoroughness and accuracy.      Ronnald Ramp, MD  [facility] [phone] (phone) [phone] (fax)  [facility]

## 2022-11-03 LAB — CBC
Hematocrit: 45.9 % (ref 34.0–46.6)
RBC: 5.17 x10E6/uL (ref 3.77–5.28)
WBC: 8.1 10*3/uL (ref 3.4–10.8)

## 2022-11-10 LAB — VITAMIN B12

## 2022-11-10 LAB — CBC
Hemoglobin: 15.6 g/dL (ref 11.1–15.9)
RDW: 12.6 % (ref 11.7–15.4)

## 2022-11-10 LAB — TSH+FREE T4: TSH: 1.54 u[IU]/mL (ref 0.450–4.500)

## 2022-11-10 LAB — VITAMIN D 25 HYDROXY (VIT D DEFICIENCY, FRACTURES): Vit D, 25-Hydroxy: 51.4 ng/mL (ref 30.0–100.0)

## 2022-11-13 LAB — COMPREHENSIVE METABOLIC PANEL
ALT: 15 IU/L (ref 0–32)
AST: 18 IU/L (ref 0–40)
Albumin/Globulin Ratio: 2
Albumin: 4.7 g/dL (ref 3.9–4.9)
Alkaline Phosphatase: 83 IU/L (ref 44–121)
BUN/Creatinine Ratio: 18 (ref 9–23)
BUN: 15 mg/dL (ref 6–20)
Bilirubin Total: 0.3 mg/dL (ref 0.0–1.2)
CO2: 25 mmol/L (ref 20–29)
Calcium: 9.4 mg/dL (ref 8.7–10.2)
Chloride: 101 mmol/L (ref 96–106)
Creatinine, Ser: 0.85 mg/dL (ref 0.57–1.00)
Globulin, Total: 2.3 g/dL (ref 1.5–4.5)
Glucose: 90 mg/dL (ref 70–99)
Potassium: 4.3 mmol/L (ref 3.5–5.2)
Sodium: 140 mmol/L (ref 134–144)
Total Protein: 7 g/dL (ref 6.0–8.5)
eGFR: 90 mL/min/{1.73_m2} (ref >59)

## 2022-11-13 LAB — HEMOGLOBIN A1C
Est. average glucose Bld gHb Est-mCnc: 111 mg/dL
Hgb A1c MFr Bld: 5.5 % (ref 4.8–5.6)

## 2022-11-13 LAB — CBC
MCH: 30.2 pg (ref 26.6–33.0)
MCHC: 34 g/dL (ref 31.5–35.7)
MCV: 89 fL (ref 79–97)
Platelets: 395 10*3/uL (ref 150–450)

## 2022-11-13 LAB — TSH+FREE T4: Free T4: 1.21 ng/dL (ref 0.82–1.77)

## 2022-12-05 NOTE — Progress Notes (Deleted)
      Established patient visit   Patient: Amanda [name]   DOB: [birth year]   36 y.o. Female  MRN: [id number] Visit Date: 12/06/2022  Today's healthcare provider: Ronnald Ramp, MD   No chief complaint on file.  Subjective      ***  Medications: Outpatient Medications Prior to Visit  Medication Sig   blood glucose meter kit and supplies Dispense based on patient and insurance preference. Use up to four times daily as directed. (FOR ICD-10 E10.9, E11.9).   COLLAGEN PO Take by mouth.   MAGNESIUM-CALCIUM-FOLIC ACID PO Take by mouth.   VITAMIN D PO Take by mouth.   Vitamin D, Ergocalciferol, (DRISDOL) 1.25 MG (50000 UNIT) CAPS capsule Take 1 capsule (50,000 Units total) by mouth every 7 (seven) days. (taking one tablet per week) walk in lab in office 1-2 weeks after completing prescription.   No facility-administered medications prior to visit.    Review of Systems  {Insert previous labs (optional):23779}  {See past labs  Heme  Chem  Endocrine  Serology  Results Review (optional):1}   Objective    There were no vitals taken for this visit. {Insert last BP/Wt (optional):23777}  {See vitals history (optional):1}  Physical Exam  ***  No results found for any visits on 12/06/22.  Assessment & Plan     Problem List Items Addressed This Visit   None    No follow-ups on file.         Ronnald Ramp, MD  [facility] [phone] (phone) [phone] (fax)  [facility]

## 2022-12-06 ENCOUNTER — Ambulatory Visit: Payer: Commercial Managed Care - PPO | Admitting: Family Medicine

## 2022-12-17 IMAGING — US US THYROID
1 series · 14 of 25 positions shown · non-contrast
Comparison: None.

CLINICAL DATA: Evaluate for thyroid nodules. Family history of
thyroid cancer.

EXAM:
THYROID ULTRASOUND
TECHNIQUE: Ultrasound examination of the thyroid gland and adjacent soft
tissues was performed.

[Series 1: us thyroid · 0.07mm/px · 14 of 51 slices shown]
[im 1/51]
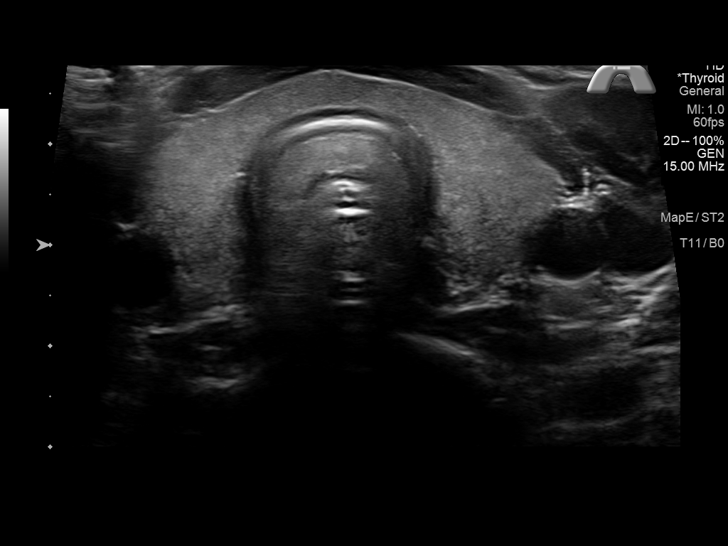
[im 5/51]
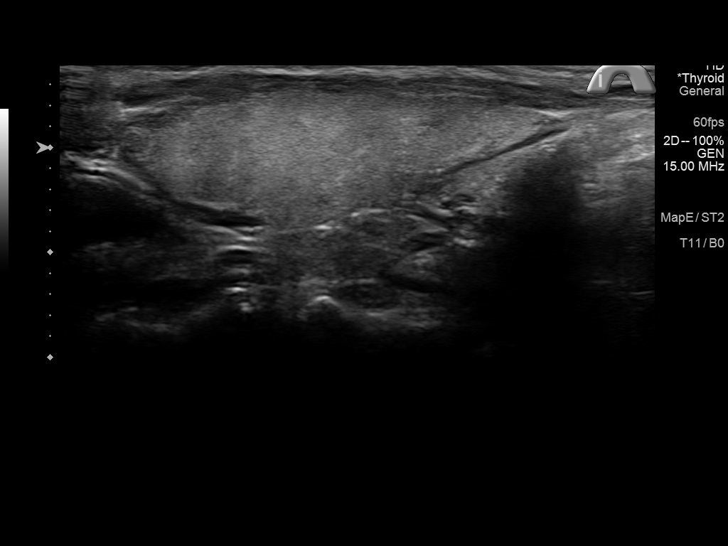
[im 9/51]
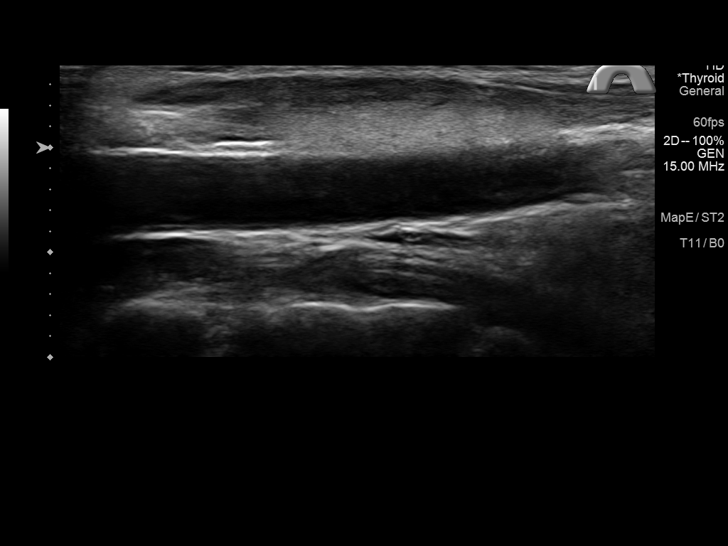
[im 13/51]
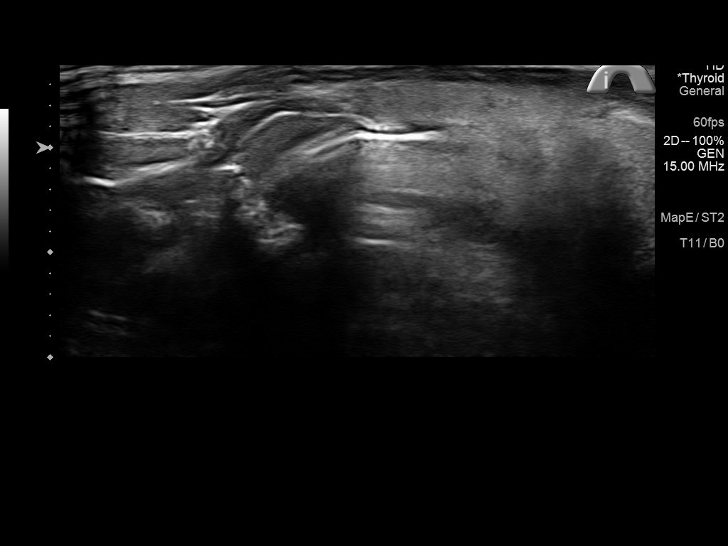
[im 17/51]
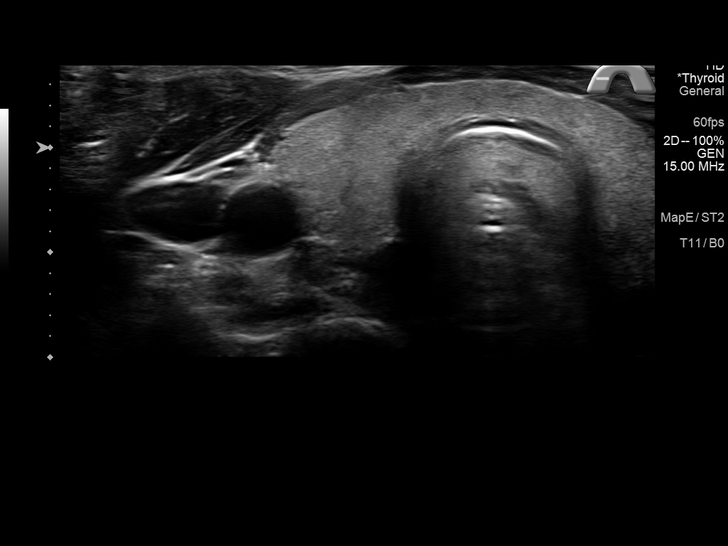
[im 19/51]
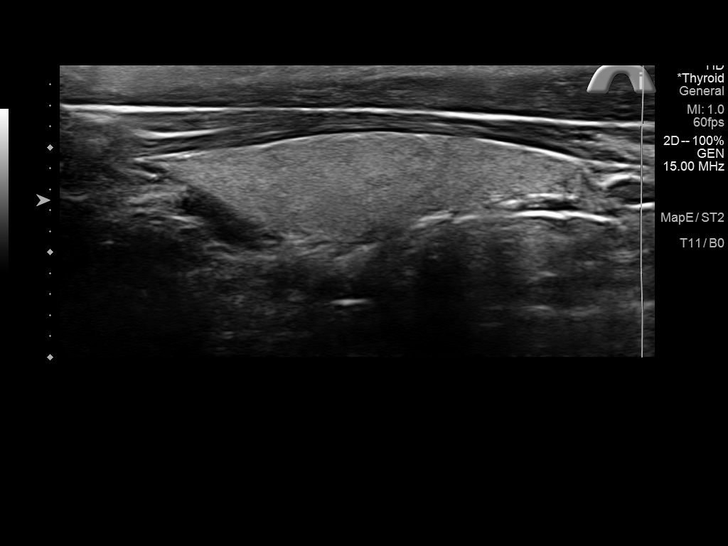
[im 23/51]
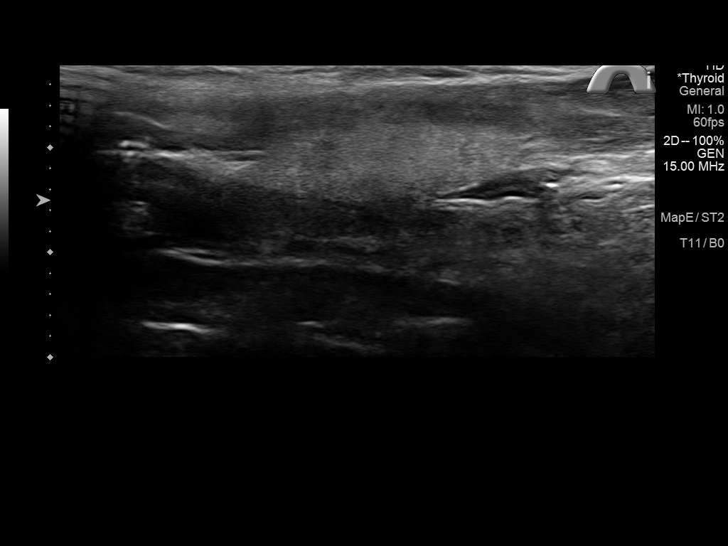
[im 28/51]
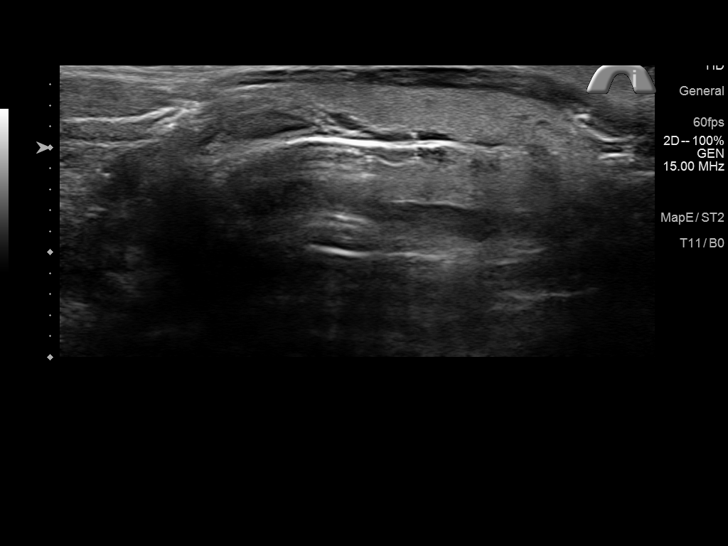
[im 32/51]
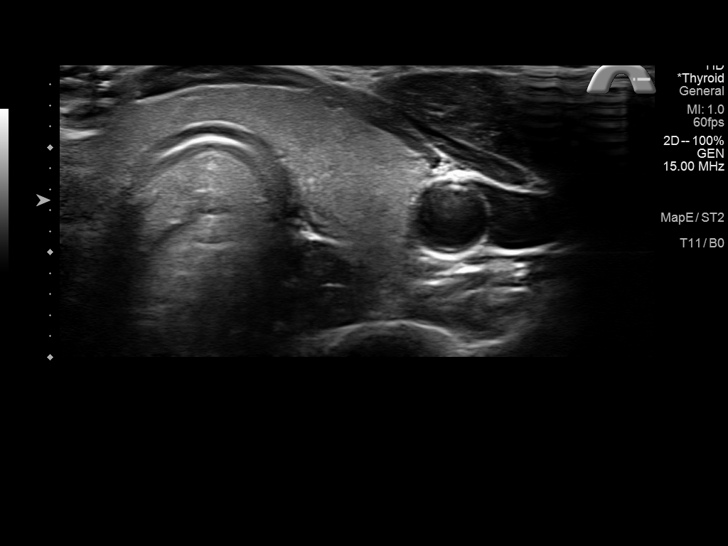
[im 34/51]
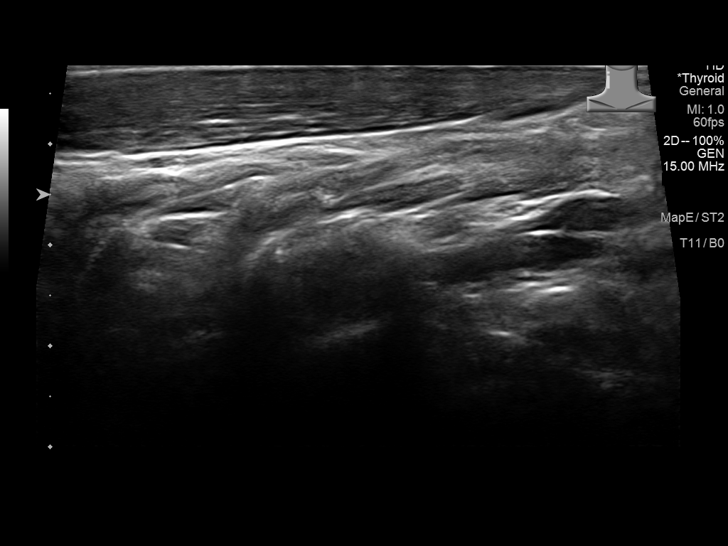
[im 38/51]
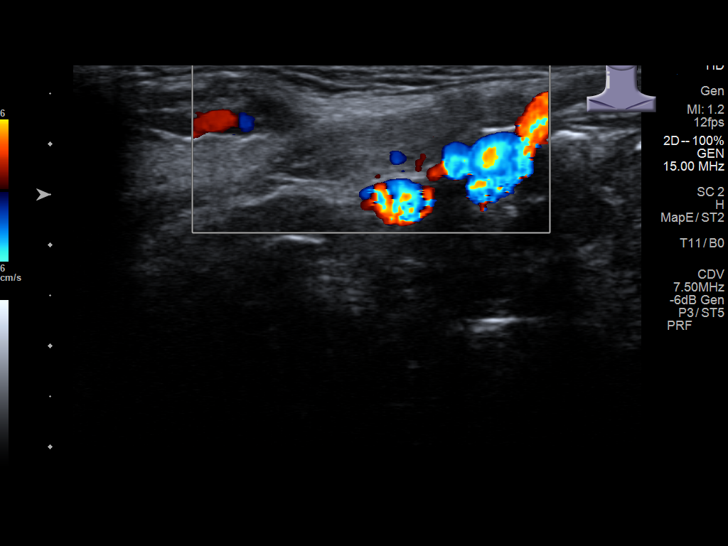
[im 42/51]
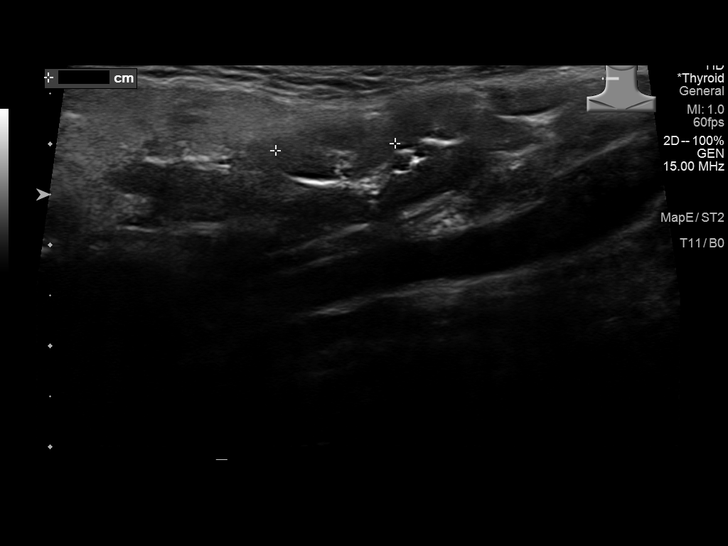
[im 46/51]
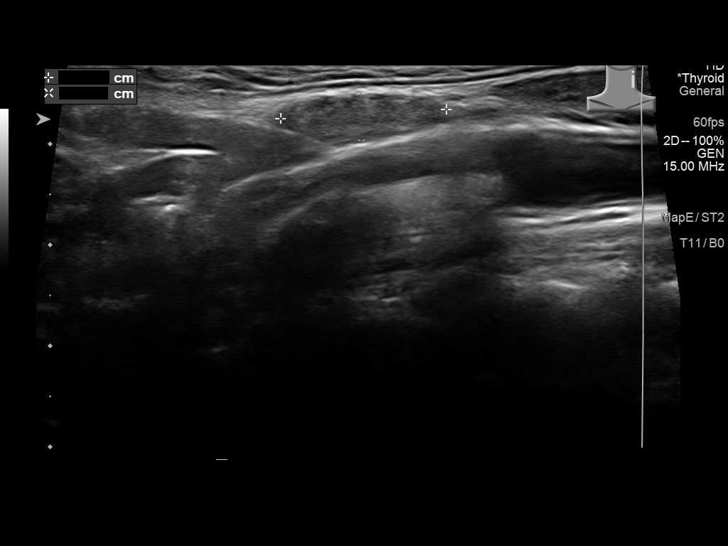
[im 51/51]
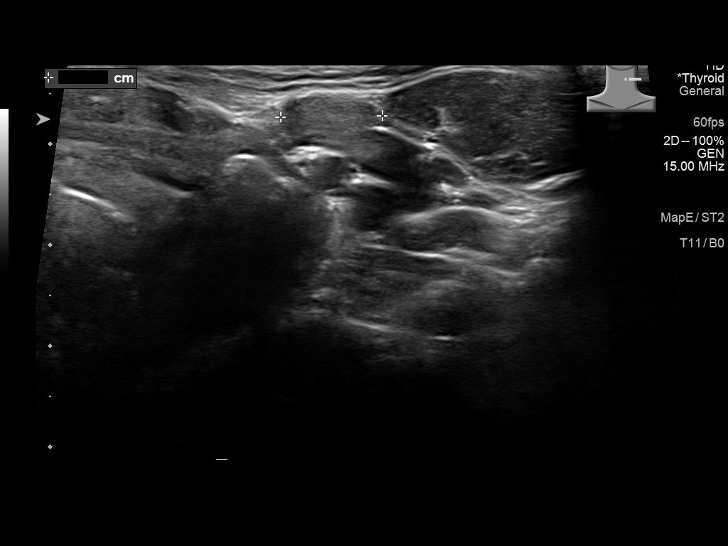

[14 of 25 positions shown; findings below may reference images not displayed]

FINDINGS: Parenchymal Echotexture: Normal

Isthmus: 0.3 cm

Right lobe: 4.1 x 1.3 x 1.3 cm

Left lobe: 3.9 x 1.1 x 1.4 cm

_________________________________________________________

Estimated total number of nodules >/= 1 cm: 0

Number of spongiform nodules >/=  2 cm not described below (TR1): 0

Number of mixed cystic and solid nodules >/= 1.5 cm not described
below (TR2): 0

_________________________________________________________

No discrete nodules are seen within the thyroid gland.

Palpable area in the right neck was evaluated. There is hypoechoic
lymph node at this area that measures 0.6 cm in the short axis. This
lymph node appears to have normal morphology. Prominent normal
appearing lymph node on the left side of the neck measures 0.5 cm in
the short axis.
IMPRESSION: 1. Normal thyroid exam.  No thyroid nodules.
2. Normal appearing lymph nodes on both sides of the neck.

## 2022-12-28 ENCOUNTER — Encounter: Payer: Self-pay | Admitting: Family Medicine

## 2023-02-06 IMAGING — CT CT NECK W/ CM
4 of 6 series · 12 of 33 positions shown, 14 images · IV contrast (APPLIED)
Comparison: Thyroid ultrasound 06/20/2021.

CLINICAL DATA: Right neck pain worse with swallowing

EXAM:
CT NECK WITH CONTRAST
TECHNIQUE: Multidetector CT imaging of the neck was performed using the
standard protocol following the bolus administration of intravenous
contrast.

[Series 3: axial neck · axial · 0.40mm/px · z∈[-216,-136]mm · 2 of 122 slices shown, 3 images]
[im 41/122  soft-tissue]
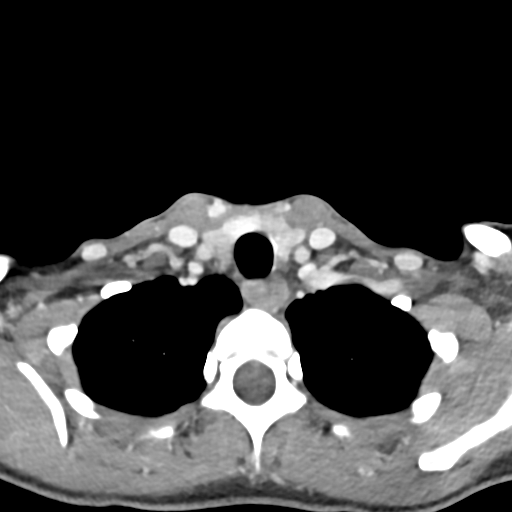
[im 41/122  bone]
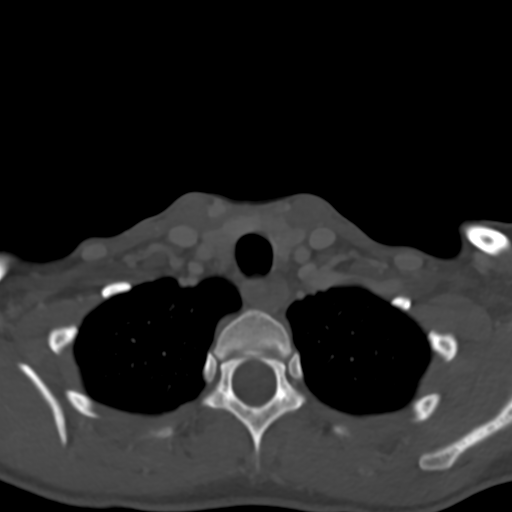
[im 81/122  bone]
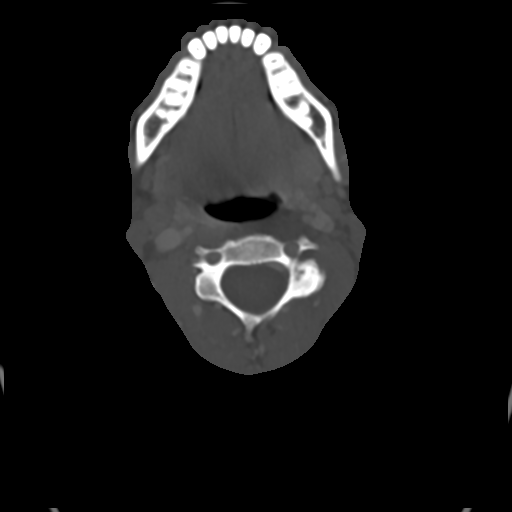

[Series 6: sag neck · sagittal · 0.41mm/px · 5 of 107 slices shown, 6 images]
[im 36/107  bone]
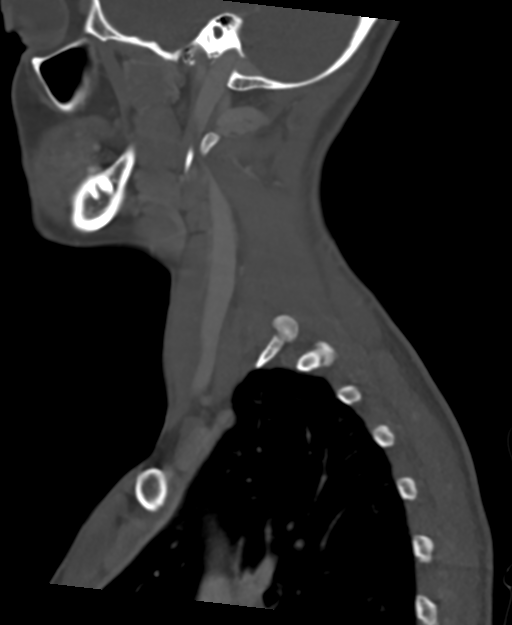
[im 45/107  bone]
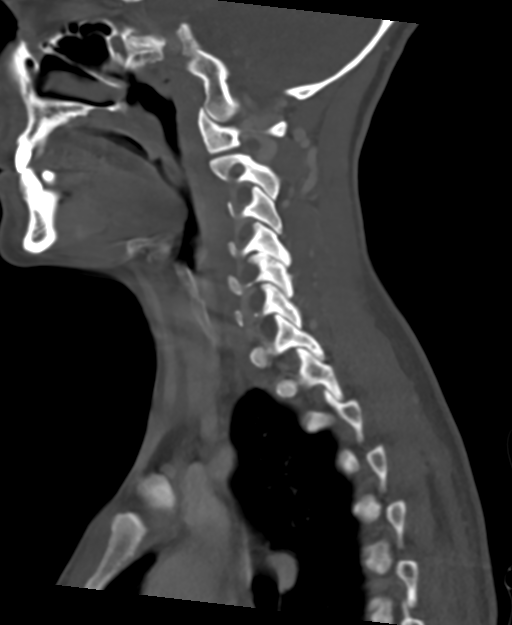
[im 54/107  soft-tissue]
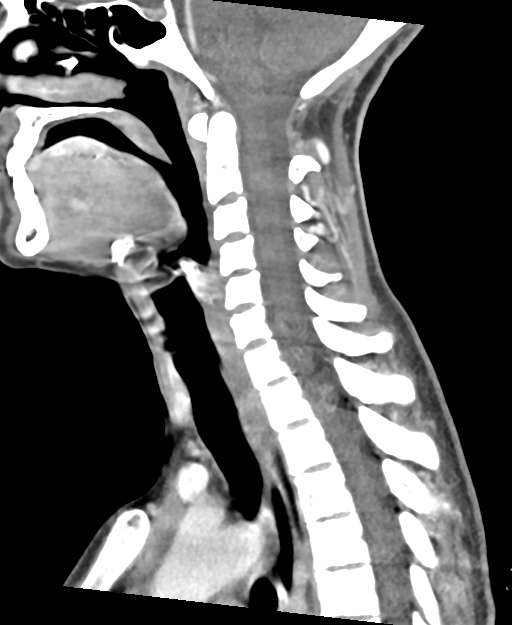
[im 54/107  bone]
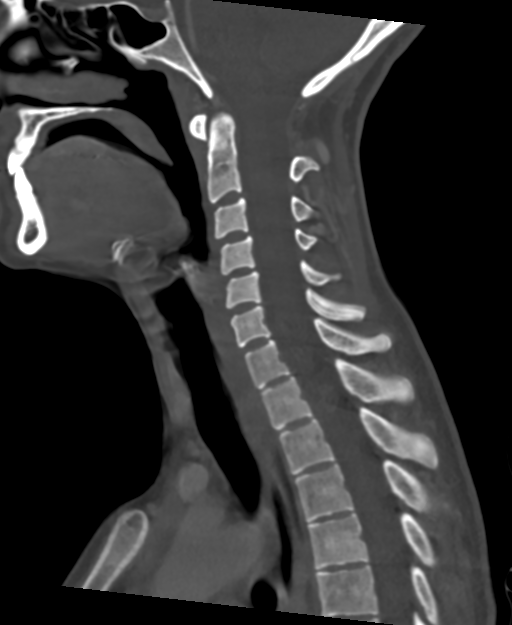
[im 62/107  bone]
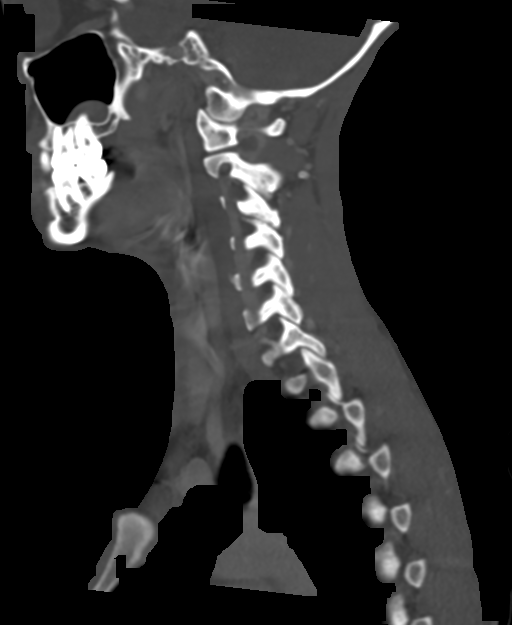
[im 71/107  bone]
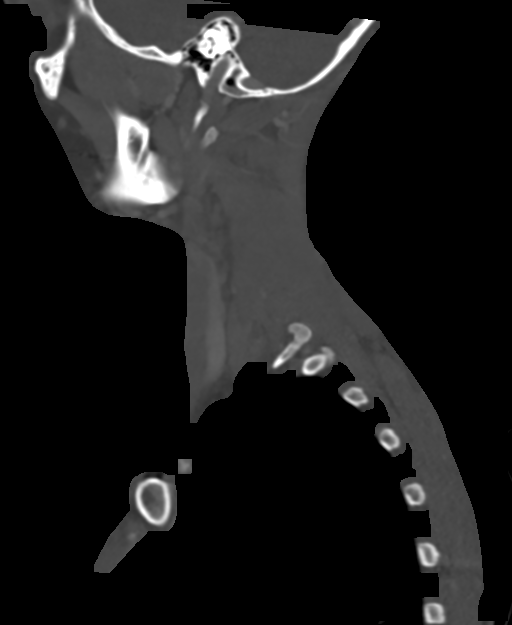

[Series 7: cor neck · coronal · 0.37mm/px · 3 of 79 slices shown]
[im 16/79  bone]
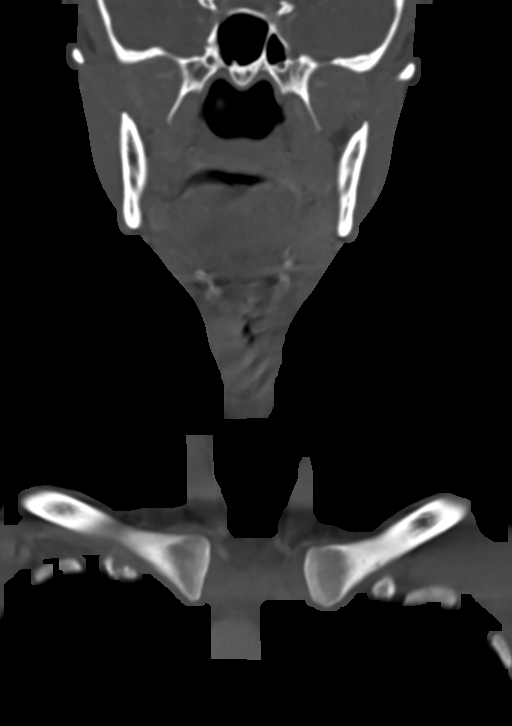
[im 32/79  bone]
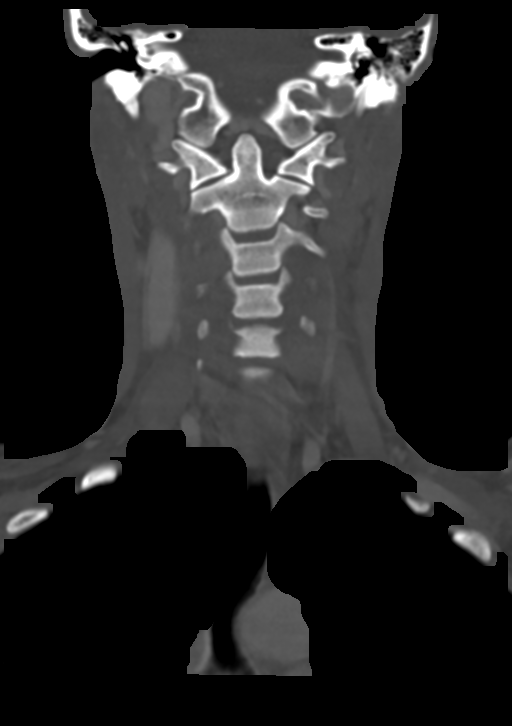
[im 47/79  bone]
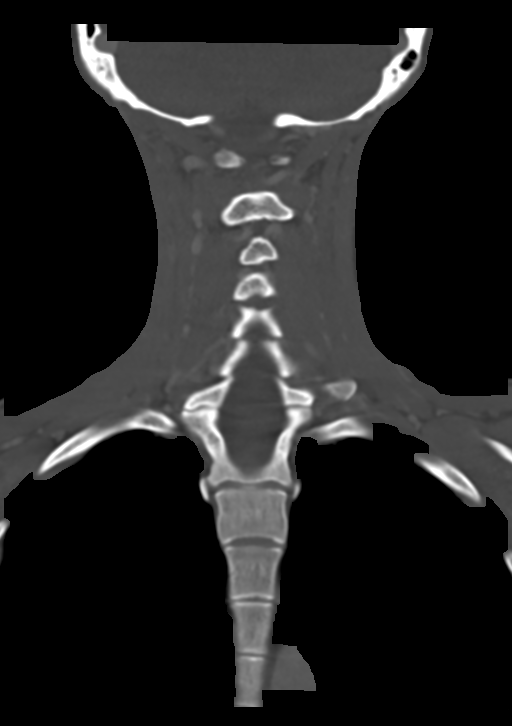

[Series 8: ax oropharynx · axial · 0.36mm/px · z∈[-247,-170]mm · 2 of 124 slices shown]
[im 42/124  bone]
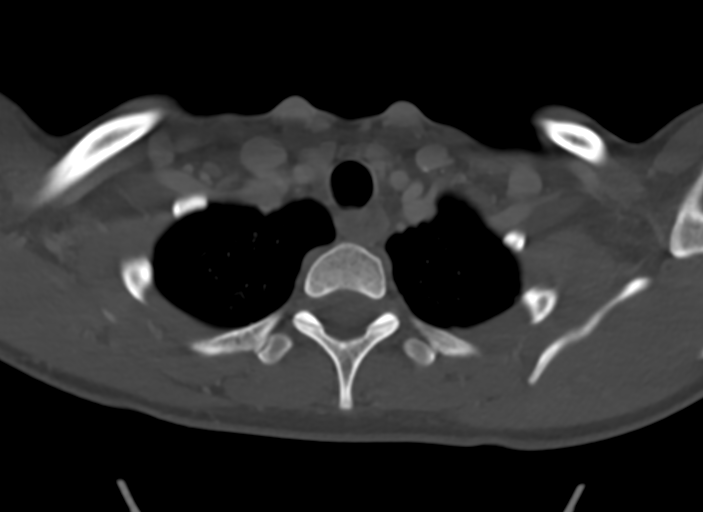
[im 83/124  bone]
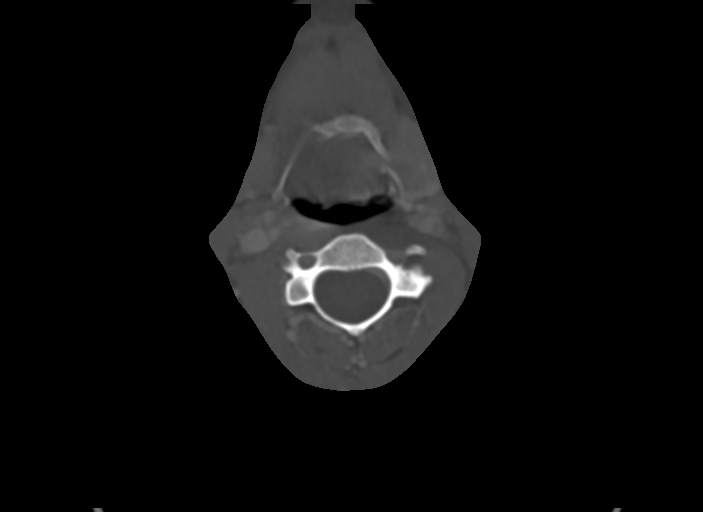

[12 of 33 positions shown; findings below may reference images not displayed]

RADIATION DOSE REDUCTION: This exam was performed according to the
departmental dose-optimization program which includes automated
exposure control, adjustment of the mA and/or kV according to
patient size and/or use of iterative reconstruction technique.

CONTRAST:  75mL OMNIPAQUE IOHEXOL 300 MG/ML  SOLN
FINDINGS: Pharynx and larynx: Normal. No mass or swelling.

Salivary glands: No inflammation, mass, or stone.

Thyroid: Normal thyroid

Lymph nodes: Subcentimeter level 2 lymph nodes bilaterally. No
pathologic adenopathy.

Vascular: Normal vascular enhancement

Limited intracranial: Negative

Visualized orbits: Negative

Mastoids and visualized paranasal sinuses: Mucosal edema base of
left maxillary sinus. Remaining sinuses clear

Skeleton: No acute abnormality. Mild facet degeneration bilaterally.

Upper chest: Lung apices clear bilaterally.

Other: Motion degraded study
IMPRESSION: No acute abnormality neck.  Negative for mass or adenopathy.

## 2023-10-11 ENCOUNTER — Ambulatory Visit: Payer: Self-pay

## 2023-10-11 NOTE — Telephone Encounter (Signed)
 Can someone please reach out to offer this pt an appt per Dr Ardeth Beckers

## 2023-10-11 NOTE — Telephone Encounter (Signed)
  Chief Complaint: presyncope  Symptoms: lightheaded Frequency: intermittent Pertinent Negatives: Patient denies syncope, fever, palpitations, tinging, weakness  Disposition: [] ED /[] Urgent Care (no appt availability in office) / [x] Appointment(In office/virtual)/ []  [facility]/ [] Home Care/ [] Refused Recommended Disposition /[] [facility]/ []  Follow-up with PCP Additional Notes:  About 3 episodes of presyncope in the past 3 weeks where she feels like she is going to pass out. She will get lightheaded and then anxious which causes her heart rate to increase and has mild sob that subsides, after resting, eating or drinking.  This happened in the past and she went to ER, diagnosed with vertigo from fluid in middle ear. This time feels different. No recent illnesses. Wondering if her blood sugar is off. One time she noticed she felt better after eating. She reports she eats sporadically and not consistent. Her father has hypoglycemia. Acute evaluation advised, offered next available with an alternate provider but patient declines offer and wishes to be scheduled with pcp. PCP next available acute visit is not until July, patient requests to be worked into [employer] schedule. Educated on care advice as documented in protocol, patient verbalized understanding. Discussed reasons to call back or call for EMS.    Copied from CRM [id number]. Topic: Clinical - Red Word Triage >> Oct 11, 2023  3:33 PM Marissa P wrote: Red Word that prompted transfer to Nurse Triage: Patient called in today, due to her almost passing out three times within the last 3 weeks, occasional shortness of breathe not sure if its because of her feeling like she will pass out, some fatigue as well and just does not feel like herself. Reason for Disposition  [1] MODERATE dizziness (e.g., interferes with normal activities) AND [2] has been evaluated by doctor (or NP/PA) for this  Protocols used: Dizziness -  Lightheadedness-A-AH

## 2023-10-11 NOTE — Telephone Encounter (Signed)
 Due to limited availability, recommend that Amanda [name] be seen by another available provider   Patient will need urgent evaluation if symptoms worsen

## 2023-10-12 ENCOUNTER — Ambulatory Visit: Admitting: Family Medicine

## 2023-10-12 NOTE — Telephone Encounter (Signed)
 Left vm for pt to call back to schedule appt.  Okay to schedule appt when pt returns call.

## 2023-10-17 ENCOUNTER — Encounter: Payer: Self-pay | Admitting: Family Medicine

## 2023-10-17 ENCOUNTER — Ambulatory Visit: Admitting: Family Medicine

## 2023-10-17 VITALS — BP 142/87 | HR 86 | Resp 16 | Ht 65.0 in | Wt 158.8 lb

## 2023-10-17 DIAGNOSIS — R0602 Shortness of breath: Secondary | ICD-10-CM | POA: Diagnosis not present

## 2023-10-17 DIAGNOSIS — E559 Vitamin D deficiency, unspecified: Secondary | ICD-10-CM | POA: Diagnosis not present

## 2023-10-17 DIAGNOSIS — R42 Dizziness and giddiness: Secondary | ICD-10-CM | POA: Diagnosis not present

## 2023-10-17 NOTE — Progress Notes (Signed)
 Established patient visit   Patient: Amanda [name]   DOB: 1985-06-28   38 y.o. Female  MRN: [id number] Visit Date: 10/17/2023  Today's healthcare provider: Jeralene Mom, MD   Chief Complaint  Patient presents with   Dizziness    Frequency: x >3 wks of episodes of presyncope.  Symptoms:lightheaded, anxious which causes her heart rate to increase,mild sob that subsides, after resting, eating or drinking.  Patient has been to ER with similar symptoms in the past and dx with vertigo,fluid in middle ear. This time feels different. No recent illnesses. Wondering if her blood sugar is off. One time she noticed she felt better after eating. She reports she eats sporadically and not consistent. Her father has hypoglycemia.     Shortness of Breath    Patient reports getting sob coming up the stairs and with any exertion.   Subjective    Discussed the use of AI scribe software for clinical note transcription with the patient, who gave verbal consent to proceed.  History of Present Illness   Amanda [name] is a 38 year old female who presents with dizziness and nausea.  Dizziness has been present for a few weeks, initially as isolated episodes, but has recently become more continuous, particularly since yesterday afternoon. She describes a sensation of pressure behind her eyes, especially on one side, and a feeling as if her eyes want to cross, though they do not. No changes in vision or diplopia. No recent headaches, fever, or cold symptoms.  Nausea has been present, particularly yesterday and today, worsening after eating. She has attempted to manage this by eating smaller meals throughout the day. Despite feeling nauseous, she has not vomited. She reports tingling in her hands today but denies any significant weakness, although she typically requires assistance with tasks like opening jars. She also describes feeling 'foggy' and has experienced chills, particularly last night, which  affected her sleep. She was unable to get comfortable and noted a preference for sleeping on her left side this morning.  She has a history of tinnitus, which was notably worse last night but has since improved. No recent changes in hearing or new onset of ringing in her ears.  She experiences chronic soreness and stiffness in her neck and back, which she attributes to normal wear and tear and manages with monthly chiropractic adjustments. She occasionally takes ibuprofen  for this discomfort but not on a regular basis.  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       Medications: Outpatient Medications Prior to Visit  Medication Sig   blood glucose meter kit and supplies Dispense based on patient and insurance preference. Use up to four times daily as directed. (FOR ICD-10 E10.9, E11.9).   COLLAGEN PO Take by mouth.   MAGNESIUM -CALCIUM -FOLIC ACID  PO Take by mouth.   VITAMIN D  PO Take by mouth.   Vitamin D , Ergocalciferol , (DRISDOL ) 1.25 MG (50000 UNIT) CAPS capsule Take 1 capsule (50,000 Units total) by mouth every 7 (seven) days. (taking one tablet per week) walk in lab in office 1-2 weeks after completing prescription.   No facility-administered medications prior to visit.   Review of Systems  Constitutional:  Positive for fatigue. Negative for appetite change, chills and fever.  Respiratory:  Negative for chest tightness and shortness of breath.   Cardiovascular:  Negative for chest pain and palpitations.  Gastrointestinal:  Negative for abdominal pain, nausea and vomiting.  Neurological:  Positive for dizziness, weakness and light-headedness.  Objective    BP (!) 142/87 (BP Location: Right Arm, Patient Position: Sitting, Cuff Size: Normal)   Pulse 86   Resp 16   Ht 5\' 5"  (1.651 m)   Wt 158 lb 12.8 oz (72 kg)   SpO2 100%   BMI 26.43 kg/m  Orthostatic Vitals for the past 48 hrs (Last 6 readings):  BP Pulse BP- Standing at 0 minutes Pulse- Standing at 0 minutes  BP- Sitting Pulse- Sitting BP- Lying Pulse- Lying  10/17/23 1125 (!) 142/87 86 -- -- -- -- -- --  10/17/23 1129 -- -- (!) 119/97 100 127/88 94 127/90 74     Physical Exam   General: Appearance:    Well developed, well nourished female in no acute distress  Eyes:    PERRL, conjunctiva/corneas clear, EOM's intact       Lungs:     Clear to auscultation bilaterally, respirations unlabored  Heart:    Normal heart rate. Normal rhythm. No murmurs, rubs, or gallops.    MS:   All extremities are intact.    Neurologic:   Awake, alert, oriented x 3. No apparent focal neurological defect.       EKG: NSR, no acute changes   Assessment & Plan        Dizziness and nausea Intermittent dizziness and nausea, progressively worsening. Possible causes include viral infection, blood cell count abnormalities, thyroid  issues, or vitamin deficiency. Orthostatic hypotension suspected due to blood pressure drop upon sitting up quickly. No inner ear problems or vertigo. EKG normal. - Order lab tests: blood counts, full metabolic panel, vitamin D  levels. - Advise hydration with water, unsweetened tea, or sports drinks for electrolyte balance.  Orthostatic hypotension Blood pressure drops upon sitting up quickly. No cardiac issues on EKG. Possible factors: hydration status, electrolyte imbalances. - Advise hydration with water, unsweetened tea, or sports drinks for electrolyte balance.  Tinnitus Chronic tinnitus. No recent changes.    No follow-ups on file.   Jeralene Mom, MD  [facility] [phone] (phone) [phone] (fax)  [facility]

## 2023-10-17 NOTE — Addendum Note (Signed)
 Addended by: Lamon Pillow on: 10/17/2023 01:22 PM   Modules accepted: Orders

## 2023-10-18 ENCOUNTER — Ambulatory Visit: Payer: Self-pay | Admitting: Family Medicine

## 2023-10-18 LAB — CBC WITH DIFFERENTIAL/PLATELET
Basophils Absolute: 0.1 10*3/uL (ref 0.0–0.2)
Basos: 1 %
EOS (ABSOLUTE): 0 10*3/uL (ref 0.0–0.4)
Eos: 1 %
Hematocrit: 47.8 % — ABNORMAL HIGH (ref 34.0–46.6)
Hemoglobin: 15.4 g/dL (ref 11.1–15.9)
Immature Grans (Abs): 0 10*3/uL (ref 0.0–0.1)
Immature Granulocytes: 0 %
Lymphocytes Absolute: 1.9 10*3/uL (ref 0.7–3.1)
Lymphs: 22 %
MCH: 29.6 pg (ref 26.6–33.0)
MCHC: 32.2 g/dL (ref 31.5–35.7)
MCV: 92 fL (ref 79–97)
Monocytes Absolute: 0.3 10*3/uL (ref 0.1–0.9)
Monocytes: 4 %
Neutrophils Absolute: 6.2 10*3/uL (ref 1.4–7.0)
Neutrophils: 72 %
Platelets: 398 10*3/uL (ref 150–450)
RBC: 5.21 x10E6/uL (ref 3.77–5.28)
RDW: 12.9 % (ref 11.7–15.4)
WBC: 8.6 10*3/uL (ref 3.4–10.8)

## 2023-10-18 LAB — COMPREHENSIVE METABOLIC PANEL WITH GFR
ALT: 17 IU/L (ref 0–32)
AST: 19 IU/L (ref 0–40)
Albumin: 4.5 g/dL (ref 3.9–4.9)
Alkaline Phosphatase: 86 IU/L (ref 44–121)
BUN/Creatinine Ratio: 16 (ref 9–23)
BUN: 14 mg/dL (ref 6–20)
Bilirubin Total: 0.3 mg/dL (ref 0.0–1.2)
CO2: 21 mmol/L (ref 20–29)
Calcium: 9.5 mg/dL (ref 8.7–10.2)
Chloride: 101 mmol/L (ref 96–106)
Creatinine, Ser: 0.88 mg/dL (ref 0.57–1.00)
Globulin, Total: 2.6 g/dL (ref 1.5–4.5)
Glucose: 128 mg/dL — ABNORMAL HIGH (ref 70–99)
Potassium: 4.1 mmol/L (ref 3.5–5.2)
Sodium: 139 mmol/L (ref 134–144)
Total Protein: 7.1 g/dL (ref 6.0–8.5)
eGFR: 86 mL/min/{1.73_m2} (ref >59)

## 2023-10-18 LAB — VITAMIN D 25 HYDROXY (VIT D DEFICIENCY, FRACTURES): Vit D, 25-Hydroxy: 33.7 ng/mL (ref 30.0–100.0)

## 2023-10-18 LAB — TSH: TSH: 1.38 u[IU]/mL (ref 0.450–4.500)

## 2023-11-12 ENCOUNTER — Other Ambulatory Visit: Payer: Self-pay | Admitting: Internal Medicine

## 2023-11-12 DIAGNOSIS — N6320 Unspecified lump in the left breast, unspecified quadrant: Secondary | ICD-10-CM

## 2023-11-14 ENCOUNTER — Ambulatory Visit: Attending: Family Medicine

## 2023-11-14 ENCOUNTER — Encounter: Payer: Self-pay | Admitting: Family Medicine

## 2023-11-14 ENCOUNTER — Ambulatory Visit: Admitting: Family Medicine

## 2023-11-14 VITALS — BP 109/76 | HR 76 | Ht 65.0 in | Wt 160.0 lb

## 2023-11-14 DIAGNOSIS — E559 Vitamin D deficiency, unspecified: Secondary | ICD-10-CM

## 2023-11-14 DIAGNOSIS — R42 Dizziness and giddiness: Secondary | ICD-10-CM

## 2023-11-14 DIAGNOSIS — I341 Nonrheumatic mitral (valve) prolapse: Secondary | ICD-10-CM | POA: Diagnosis not present

## 2023-11-14 MED ORDER — MIDODRINE HCL 2.5 MG PO TABS: 2.5000 mg | ORAL_TABLET | Freq: Three times a day (TID) | ORAL | 0 refills | Status: DC

## 2023-11-14 NOTE — Progress Notes (Signed)
 Established patient visit   Patient: Amanda [name]   DOB: 04-10-1986   38 y.o. Female  MRN: [id number] Visit Date: 11/14/2023  Today's healthcare provider: Rockie Agent, MD   Chief Complaint  Patient presents with   Dizziness    She is feeling almost the same, just not to the point of passing out    Subjective     HPI     Dizziness    Additional comments: She is feeling almost the same, just not to the point of passing out       Last edited by Thelbert Eulalio HERO, CMA on 11/14/2023  8:18 AM.       Discussed the use of AI scribe software for clinical note transcription with the patient, who gave verbal consent to proceed.  History of Present Illness Amanda [name] is a 38 year old female who presents with persistent dizziness and associated symptoms.  She has been experiencing persistent dizziness for about a month, described as feeling lightheaded and 'floaty'. These episodes are often accompanied by anxiety, increased heart rate, and shortness of breath, which improve with rest. The symptoms have been consistent since returning from summer break.  The dizziness occurs in various situations, such as walking in the woods, driving, and after eating. Initially, she suspected a link to blood sugar levels, but monitoring showed normal levels. Despite eating smaller meals and paying attention to her diet, the symptoms persist.  She experiences difficulty breathing and a sensation of her heart working hard after eating even small amounts of food. There is a 'weird feeling' in her extremities, not quite tingling, but a sensation of floating. She has been avoiding exertion and eating large meals due to fear of exacerbating symptoms.  She feels fatigued, especially as the day progresses, and notes a decrease in energy to the point where she speaks at a lower volume. No depression, as she enjoys her life.  She has a history of mitral valve prolapse, which was  monitored during her pregnancies, but an echocardiogram six years ago indicated resolution of the condition. She also has tinnitus, which has worsened recently, but denies any hearing loss.  Her blood pressure was noted to be higher than usual, but not alarmingly high overall. She has been trying to stay hydrated and has been cautious about exertion due to her symptoms.  Family history includes her biological father having hypoglycemia and undergoing a stress test in his mid-thirties.  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 No fainting, hearing loss, upper respiratory or sinus infections prior to symptom onset. Reports increased tinnitus, fatigue, and difficulty breathing after eating.     Past Medical History:  Diagnosis Date   Abnormal Pap smear    Delayed delivery after SROM (spontaneous rupture of membranes) 04/01/2017   Endometriosis    Heart murmur    Miscarriage    MVP (mitral valve prolapse)    SVD (spontaneous vaginal delivery) 10/24/2013    Medications: Outpatient Medications Prior to Visit  Medication Sig   blood glucose meter kit and supplies Dispense based on patient and insurance preference. Use up to four times daily as directed. (FOR ICD-10 E10.9, E11.9). (Patient not taking: Reported on 11/14/2023)   COLLAGEN PO Take by mouth. (Patient not taking: Reported on 11/14/2023)   MAGNESIUM -CALCIUM -FOLIC ACID  PO Take by mouth. (Patient not taking: Reported on 11/14/2023)   VITAMIN D  PO Take by mouth. (Patient not taking: Reported on 11/14/2023)  Vitamin D , Ergocalciferol , (DRISDOL ) 1.25 MG (50000 UNIT) CAPS capsule Take 1 capsule (50,000 Units total) by mouth every 7 (seven) days. (taking one tablet per week) walk in lab in office 1-2 weeks after completing prescription. (Patient not taking: Reported on 11/14/2023)   No facility-administered medications prior to visit.    Review of Systems  Last metabolic panel Lab Results  Component Value Date    GLUCOSE 128 (H) 10/17/2023   NA 139 10/17/2023   K 4.1 10/17/2023   CL 101 10/17/2023   CO2 21 10/17/2023   BUN 14 10/17/2023   CREATININE 0.88 10/17/2023   EGFR 86 10/17/2023   CALCIUM  9.5 10/17/2023   PROT 7.1 10/17/2023   ALBUMIN 4.5 10/17/2023   LABGLOB 2.6 10/17/2023   AGRATIO 2.0 11/02/2022   BILITOT 0.3 10/17/2023   ALKPHOS 86 10/17/2023   AST 19 10/17/2023   ALT 17 10/17/2023   ANIONGAP 6 08/10/2021   Last lipids Lab Results  Component Value Date   CHOL 151 08/02/2021   HDL 69.80 08/02/2021   LDLCALC 59 08/02/2021   TRIG 110.0 08/02/2021   CHOLHDL 2 08/02/2021   Last hemoglobin A1c Lab Results  Component Value Date   HGBA1C 5.5 11/02/2022   Last thyroid  functions Lab Results  Component Value Date   TSH 1.380 10/17/2023   Last vitamin D  Lab Results  Component Value Date   VD25OH 33.7 10/17/2023   Last vitamin B12 and Folate Lab Results  Component Value Date   VITAMINB12 CANCELED 11/02/2022   FOLATE >24.2 08/02/2021        Objective    BP 109/76   Pulse 76   Ht 5' 5 (1.651 m)   Wt 160 lb (72.6 kg)   SpO2 98%   BMI 26.63 kg/m  BP Readings from Last 3 Encounters:  11/14/23 109/76  10/17/23 (!) 142/87  11/02/22 107/75   Wt Readings from Last 3 Encounters:  11/14/23 160 lb (72.6 kg)  10/17/23 158 lb 12.8 oz (72 kg)  11/02/22 154 lb (69.9 kg)        Physical Exam  Physical Exam VITALS: P- 76, BP- 109/76 GENERAL: Not pale, no lower extremity edema. CHEST: Lungs clear to auscultation bilaterally. CARDIOVASCULAR: PACs noted, regular rhythm, normal rate. NEUROLOGICAL: Negative Dix-Hallpike maneuver, no nystagmus, no torsional nystagmus.    No results found for any visits on 11/14/23.  Assessment & Plan     Problem List Items Addressed This Visit       Cardiovascular and Mediastinum   MVP (mitral valve prolapse)--hx, but negative cardiac w/u during pregancy   Relevant Medications   midodrine (PROAMATINE) 2.5 MG tablet      Other   Vitamin D  deficiency   Other Visit Diagnoses       Dizziness    -  Primary   Relevant Medications   midodrine (PROAMATINE) 2.5 MG tablet   Other Relevant Orders   LONG TERM MONITOR (3-14 DAYS)   ECHOCARDIOGRAM COMPLETE       Assessment & Plan Dizziness and Fatigue Persistent dizziness and fatigue, particularly postprandial, with lightheadedness, tachycardia, and dyspnea for one month. Blood pressure slightly elevated from baseline but not concerning. Differential includes postprandial hypotension, cardiac issues such as mitral valve prolapse, or autonomic dysfunction. Previous echocardiogram in 2015 showed mitral valve prolapse, which was monitored and treated; patient reports it was monitored with every pregnancy and found to have resolved after her last pregnancy, but symptoms suggest possible cardiac involvement. Elevated hematocrit noted without smoking history  or other common causes. Discussed midodrine for symptom management, with potential side effects of headaches and heart rate changes. Considered increased dietary salt intake for blood pressure regulation. - Order Zio patch heart monitor for one week to assess cardiac abnormalities. - Order echocardiogram to evaluate cardiac structure and function. - Consider starting midodrine 2.5 mg three times daily for dizziness management, monitoring for headaches or heart rate changes. - Advise increasing dietary salt intake for blood pressure regulation. - Schedule follow-up in 3-4 weeks to review results and assess symptom progression.  Tinnitus Worsening tinnitus attributed to [employer] as a Musician. No associated hearing loss. Increased severity not linked to dizziness and fatigue.  General Health Maintenance Scheduled for left breast ultrasound on July 15th as part of routine health maintenance. - Proceed with scheduled ultrasound of the left breast on July 15th.   Total time spent on today's visit was 45 minutes,  including both face-to-face time interviewing and examining the patient, reviewing medical record including labs/imaging/specialist notes and prior echocardiogram, developing and discussing further evaluation,answering patient's questions, coordinating follow up care in addition to documenting in the patient's chart.    Return in about 1 month (around 12/14/2023) for dizziness.         Rockie Agent, MD  [facility] [phone] (phone) [phone] (fax)  [facility]

## 2023-11-26 ENCOUNTER — Other Ambulatory Visit

## 2023-11-27 ENCOUNTER — Ambulatory Visit
Admission: RE | Admit: 2023-11-27 | Discharge: 2023-11-27 | Disposition: A | Discharge status: Home / Self Care | Source: Ambulatory Visit | Attending: Internal Medicine | Admitting: Internal Medicine

## 2023-11-27 DIAGNOSIS — N6320 Unspecified lump in the left breast, unspecified quadrant: Secondary | ICD-10-CM

## 2023-12-04 ENCOUNTER — Encounter

## 2023-12-04 ENCOUNTER — Other Ambulatory Visit

## 2023-12-08 DIAGNOSIS — R42 Dizziness and giddiness: Secondary | ICD-10-CM | POA: Diagnosis not present

## 2023-12-12 ENCOUNTER — Ambulatory Visit: Attending: Family Medicine

## 2023-12-12 ENCOUNTER — Encounter: Payer: Self-pay | Admitting: Family Medicine

## 2023-12-12 ENCOUNTER — Other Ambulatory Visit (HOSPITAL_COMMUNITY)

## 2023-12-12 ENCOUNTER — Ambulatory Visit

## 2023-12-12 DIAGNOSIS — R42 Dizziness and giddiness: Secondary | ICD-10-CM

## 2023-12-12 LAB — ECHOCARDIOGRAM COMPLETE
AR max vel: 2.86 cm2
AV Area VTI: 3.28 cm2
AV Area mean vel: 2.92 cm2
AV Mean grad: 2 mmHg
AV Peak grad: 4.5 mmHg
Ao pk vel: 1.06 m/s
Area-P 1/2: 4.63 cm2
S' Lateral: 2.24 cm

## 2023-12-14 ENCOUNTER — Ambulatory Visit: Admitting: Family Medicine

## 2023-12-14 ENCOUNTER — Encounter: Payer: Self-pay | Admitting: Family Medicine

## 2023-12-14 VITALS — BP 102/89 | HR 86 | Ht 65.0 in | Wt 160.0 lb

## 2023-12-14 DIAGNOSIS — I9589 Other hypotension: Secondary | ICD-10-CM

## 2023-12-14 DIAGNOSIS — R42 Dizziness and giddiness: Secondary | ICD-10-CM | POA: Diagnosis not present

## 2023-12-14 DIAGNOSIS — I341 Nonrheumatic mitral (valve) prolapse: Secondary | ICD-10-CM | POA: Diagnosis not present

## 2023-12-14 NOTE — Progress Notes (Signed)
 Established patient visit   Patient: Amanda [name]   DOB: 1986-04-23   38 y.o. Female  MRN: [id number] Visit Date: 12/14/2023  Today's healthcare provider: Rockie Agent, MD   Chief Complaint  Patient presents with   Dizziness    Still experiencing the dizziness just not as much because she is not pushing herself     Subjective     HPI     Dizziness    Additional comments: Still experiencing the dizziness just not as much because she is not pushing herself        Last edited by Thelbert Eulalio HERO, CMA on 12/14/2023  8:10 AM.       Discussed the use of AI scribe software for clinical note transcription with the patient, who gave verbal consent to proceed.  History of Present Illness [name] is a 38 year old female who presents with dizziness for follow-up.  She experiences episodes of dizziness described as feeling 'off balance' and 'like everything is getting farther away,' without room-spinning sensation. These episodes are sometimes accompanied by palpitations, which she finds uncomfortable. Symptoms occur both after eating and during fasting states, such as morning hikes. She manages these symptoms by eating smaller meals and staying hydrated with water and Gatorade.  She has a history of early atrial and ventricular contractions noted on previous cardiac monitoring, but no sustained arrhythmias were detected. She has not started the prescribed medication for postprandial hypotension as she forgot to pick it up.  She experiences difficulty breathing and lightheadedness, particularly after eating, and manages this by eating smaller meals. She has reduced her caffeine intake, limiting coffee to a few times a week, and is trying to eat more consistently in the morning, despite not being a regular breakfast eater.  She recently underwent a breast ultrasound due to a cyst in her left breast. She recounted a distressing experience where she was  mistakenly informed she might have breast cancer due to a mix-up with another patient's chart. Her grandmother passed away from breast cancer, contributing to her distress during the recent imaging incident.     Past Medical History:  Diagnosis Date   Abnormal Pap smear    Delayed delivery after SROM (spontaneous rupture of membranes) 04/01/2017   Endometriosis    Heart murmur    Miscarriage    MVP (mitral valve prolapse)    SVD (spontaneous vaginal delivery) 10/24/2013    Medications: Outpatient Medications Prior to Visit  Medication Sig   blood glucose meter kit and supplies Dispense based on patient and insurance preference. Use up to four times daily as directed. (FOR ICD-10 E10.9, E11.9). (Patient not taking: Reported on 11/14/2023)   COLLAGEN PO Take by mouth. (Patient not taking: Reported on 11/14/2023)   MAGNESIUM -CALCIUM -FOLIC ACID  PO Take by mouth. (Patient not taking: Reported on 11/14/2023)   midodrine  (PROAMATINE ) 2.5 MG tablet Take 1 tablet (2.5 mg total) by mouth 3 (three) times daily with meals. (Patient not taking: Reported on 12/14/2023)   VITAMIN D  PO Take by mouth. (Patient not taking: Reported on 11/14/2023)   Vitamin D , Ergocalciferol , (DRISDOL ) 1.25 MG (50000 UNIT) CAPS capsule Take 1 capsule (50,000 Units total) by mouth every 7 (seven) days. (taking one tablet per week) walk in lab in office 1-2 weeks after completing prescription. (Patient not taking: Reported on 11/14/2023)   No facility-administered medications prior to visit.    Review of Systems  Last metabolic panel Lab Results  Component Value Date  GLUCOSE 128 (H) 10/17/2023   NA 139 10/17/2023   K 4.1 10/17/2023   CL 101 10/17/2023   CO2 21 10/17/2023   BUN 14 10/17/2023   CREATININE 0.88 10/17/2023   EGFR 86 10/17/2023   CALCIUM  9.5 10/17/2023   PROT 7.1 10/17/2023   ALBUMIN 4.5 10/17/2023   LABGLOB 2.6 10/17/2023   AGRATIO 2.0 11/02/2022   BILITOT 0.3 10/17/2023   ALKPHOS 86 10/17/2023    AST 19 10/17/2023   ALT 17 10/17/2023   ANIONGAP 6 08/10/2021   Last hemoglobin A1c Lab Results  Component Value Date   HGBA1C 5.5 11/02/2022   Last thyroid  functions Lab Results  Component Value Date   TSH 1.380 10/17/2023   ECHOCARDIOGRAM COMPLETE Result Date: 12/12/2023    ECHOCARDIOGRAM REPORT   Patient Name:   Amanda [name] Date of Exam: 12/12/2023 Medical Rec #:  [id number]        Height:       65.0 in Accession #:    [id number]       Weight:       160.0 lb Date of Birth:  Dec 28, 1985        BSA:          1.799 m Patient Age:    37 years         BP:           109/76 mmHg Patient Gender: F                HR:           81 bpm. Exam Location:  [facility] Procedure: 2D Echo, Cardiac Doppler and Color Doppler (Both Spectral and Color            Flow Doppler were utilized during procedure). Indications:    R06.9 DOE; R42 Lightheaded  History:        Patient has no prior history of Echocardiogram examinations.                 Mitral Valve Prolapse, Signs/Symptoms:Dizziness/Lightheadedness,                 Shortness of Breath, Dyspnea and Fatigue; Risk                 Factors:Non-Smoker.  Sonographer:    Doyal Point MHA, BS, RDCS Referring Phys: [id number] [facility]MONS-ROBIN[facility] IMPRESSIONS  1. Left ventricular ejection fraction, by estimation, is 60 to 65%. The left ventricle has normal function. The left ventricle has no regional wall motion abnormalities. Left ventricular diastolic parameters were normal.  2. Right ventricular systolic function is normal. The right ventricular size is normal.  3. The mitral valve is normal in structure. No evidence of mitral valve regurgitation. No evidence of mitral stenosis.  4. The aortic valve is normal in structure. Aortic valve regurgitation is not visualized. No aortic stenosis is present.  5. The inferior vena cava is normal in size with greater than 50% respiratory variability, suggesting right atrial pressure of 3 mmHg. FINDINGS  Left Ventricle: Left  ventricular ejection fraction, by estimation, is 60 to 65%. The left ventricle has normal function. The left ventricle has no regional wall motion abnormalities. Strain was performed and the global longitudinal strain is indeterminate. The left ventricular internal cavity size was normal in size. There is no left ventricular hypertrophy. Left ventricular diastolic parameters were normal. Right Ventricle: The right ventricular size is normal. No increase in right ventricular wall thickness. Right ventricular systolic function is normal. Left Atrium:  Left atrial size was normal in size. Right Atrium: Right atrial size was normal in size. Pericardium: There is no evidence of pericardial effusion. Mitral Valve: The mitral valve is normal in structure. No evidence of mitral valve regurgitation. No evidence of mitral valve stenosis. Tricuspid Valve: The tricuspid valve is normal in structure. Tricuspid valve regurgitation is mild . No evidence of tricuspid stenosis. Aortic Valve: The aortic valve is normal in structure. Aortic valve regurgitation is not visualized. No aortic stenosis is present. Aortic valve mean gradient measures 2.0 mmHg. Aortic valve peak gradient measures 4.5 mmHg. Aortic valve area, by VTI measures 3.28 cm. Pulmonic Valve: The pulmonic valve was normal in structure. Pulmonic valve regurgitation is not visualized. No evidence of pulmonic stenosis. Aorta: The aortic root is normal in size and structure. Venous: The inferior vena cava is normal in size with greater than 50% respiratory variability, suggesting right atrial pressure of 3 mmHg. IAS/Shunts: No atrial level shunt detected by color flow Doppler. Additional Comments: 3D was performed not requiring image post processing on an independent workstation and was indeterminate.  LEFT VENTRICLE PLAX 2D LVIDd:         3.91 cm   Diastology LVIDs:         2.24 cm   LV e' medial:    9.86 cm/s LV PW:         1.11 cm   LV E/e' medial:  9.4 LV IVS:         1.07 cm   LV e' lateral:   14.30 cm/s LVOT diam:     2.20 cm   LV E/e' lateral: 6.5 LV SV:         67 LV SV Index:   37 LVOT Area:     3.80 cm  RIGHT VENTRICLE RV S prime:     9.28 cm/s TAPSE (M-mode): 2.2 cm LEFT ATRIUM             Index LA diam:        2.80 cm 1.56 cm/m LA Vol (A2C):   31.6 ml 17.56 ml/m LA Vol (A4C):   29.6 ml 16.45 ml/m LA Biplane Vol: 32.3 ml 17.95 ml/m  AORTIC VALVE AV Area (Vmax):    2.86 cm AV Area (Vmean):   2.92 cm AV Area (VTI):     3.28 cm AV Vmax:           106.00 cm/s AV Vmean:          73.100 cm/s AV VTI:            0.203 m AV Peak Grad:      4.5 mmHg AV Mean Grad:      2.0 mmHg LVOT Vmax:         79.80 cm/s LVOT Vmean:        56.100 cm/s LVOT VTI:          0.175 m LVOT/AV VTI ratio: 0.86  AORTA Ao Root diam: 2.80 cm MITRAL VALVE MV Area (PHT): 4.63 cm    SHUNTS MV Decel Time: 164 msec    Systemic VTI:  0.18 m MV E velocity: 93.10 cm/s  Systemic Diam: 2.20 cm MV A velocity: 79.10 cm/s MV E/A ratio:  1.18 Evalene Lunger MD Electronically signed by Evalene Lunger MD Signature Date/Time: 12/12/2023/4:32:43 PM    Final    LONG TERM MONITOR (3-14 DAYS) Result Date: 12/08/2023 HR 50 to 169, average 85. Rare supraventricular and ventricular ectopy. No sustained arrhythmias. No atrial fibrillation. Symptom  trigger episodes correspond to sinus rhythm +/- PAC/PVC. Ole T. Cindie, MD, [facility], [facility] Cardiac Electrophysiology   US  LIMITED ULTRASOUND INCLUDING AXILLA LEFT BREAST  Result Date: 11/27/2023 CLINICAL DATA:  Screening recall LEFT breast mass on baseline mammogram dated November 07, 2023 EXAM: ULTRASOUND OF THE LEFT BREAST COMPARISON:  Screening mammogram November 07, 2023 FINDINGS: Targeted ultrasound is performed, showing a 17 x 12 x 17 mm oval circumscribed anechoic simple cyst with posterior enhancement. This correlates with the mammographic finding.   IMPRESSION: LEFT breast mass described at the time of screening mammogram correlates with a benign simple cyst.  RECOMMENDATION: Patient may return to routine screening mammogram in 1 year.(Code:SM-B-01Y) I have discussed the findings and recommendations with the patient. If applicable, a reminder letter will be sent to the patient regarding the next appointment. BI-RADS CATEGORY  2: Benign. Electronically Signed   By: Norleen Croak M.D.   On: 11/27/2023 09:10         Objective    BP 102/89   Pulse 86   Ht 5' 5 (1.651 m)   Wt 160 lb (72.6 kg)   SpO2 99%   BMI 26.63 kg/m  BP Readings from Last 3 Encounters:  12/14/23 102/89  11/14/23 109/76  10/17/23 (!) 142/87   Wt Readings from Last 3 Encounters:  12/14/23 160 lb (72.6 kg)  11/14/23 160 lb (72.6 kg)  10/17/23 158 lb 12.8 oz (72 kg)        Physical Exam  Physical Exam GENERAL: No acute distress. CHEST: Lungs clear to auscultation bilaterally, no wheezing, no crackles. CARDIOVASCULAR: Regular rate and rhythm, no murmur. NEUROLOGICAL: Pupils equal and reactive to light bilaterally.    No results found for any visits on 12/14/23.  Assessment & Plan     Problem List Items Addressed This Visit       Cardiovascular and Mediastinum   MVP (mitral valve prolapse)--hx, but negative cardiac w/u during pregancy     Other   Dizziness - Primary   Dizziness and Lightheadedness Intermittent episodes of dizziness and lightheadedness, often postprandial or with exertion. Previous cardiac monitoring showed early atrial and ventricular contractions without sustained arrhythmia. Normal echocardiogram. Symptoms include off-balance sensation and palpitations. Differential includes postprandial hypotension, dehydration, cardiac or endocrine etiology, GI issue, or vascular insufficiency during digestion. Potential cardiology referral if labs are normal, with possible beta-blocker therapy for palpitations. - Order A1c to rule out diabetes. - Order cortisol level to assess for adrenal insufficiency or excess. - Order insulin level to evaluate for  hypoglycemia. - Refer to cardiology for further evaluation of palpitations and possible beta-blocker therapy if lab results are normal.      Relevant Orders   Hemoglobin A1c   Cortisol   Insulin and C-Peptide   Other Visit Diagnoses       Postprandial hypotension       Relevant Orders   Hemoglobin A1c   Cortisol   Insulin and C-Peptide        Assessment & Plan   Breast Cyst -f/u with breast imaging as previously scheduled   General Health Maintenance Lifestyle modifications include reducing caffeine, eating smaller frequent meals, and increasing hydration with water and Gatorade to manage symptoms. - Encourage continued lifestyle modifications, including hydration and dietary adjustments.     Return in about 6 weeks (around 01/25/2024) for Dizziness.         Rockie Agent, MD  [facility] [phone] (phone) [phone] (fax)  [facility]

## 2023-12-14 NOTE — Assessment & Plan Note (Addendum)
 Dizziness and Lightheadedness Intermittent episodes of dizziness and lightheadedness, often postprandial or with exertion. Previous cardiac monitoring showed early atrial and ventricular contractions without sustained arrhythmia. Normal echocardiogram. Symptoms include off-balance sensation and palpitations. Differential includes postprandial hypotension, dehydration, cardiac or endocrine etiology, GI issue, or vascular insufficiency during digestion. Potential cardiology referral if labs are normal - Order A1c to rule out diabetes. - Order cortisol level to assess for adrenal insufficiency or excess. - Order insulin level to evaluate for hypoglycemia. - Refer to cardiology for further evaluation of palpitations and possible pharmacotherapy

## 2023-12-19 ENCOUNTER — Ambulatory Visit: Payer: Self-pay | Admitting: Family Medicine

## 2023-12-19 LAB — HEMOGLOBIN A1C
Est. average glucose Bld gHb Est-mCnc: 108 mg/dL
Hgb A1c MFr Bld: 5.4 % (ref 4.8–5.6)

## 2023-12-19 LAB — CORTISOL: Cortisol: 7.3 ug/dL (ref 6.2–19.4)

## 2023-12-19 LAB — INSULIN AND C-PEPTIDE, SERUM
C-Peptide: 1.9 ng/mL (ref 1.1–4.4)
INSULIN: 5.3 u[IU]/mL (ref 2.6–24.9)

## 2024-01-15 ENCOUNTER — Other Ambulatory Visit (HOSPITAL_COMMUNITY)

## 2024-01-30 ENCOUNTER — Encounter: Payer: Self-pay | Admitting: Family Medicine

## 2024-01-30 ENCOUNTER — Ambulatory Visit: Admitting: Family Medicine

## 2024-01-30 VITALS — BP 116/86 | HR 66 | Temp 98.1°F | Ht 65.0 in | Wt 162.5 lb

## 2024-01-30 DIAGNOSIS — R42 Dizziness and giddiness: Secondary | ICD-10-CM

## 2024-01-30 DIAGNOSIS — R002 Palpitations: Secondary | ICD-10-CM | POA: Diagnosis not present

## 2024-01-30 MED ORDER — MIDODRINE HCL 2.5 MG PO TABS: ORAL_TABLET | ORAL | 1 refills | Status: DC

## 2024-01-30 NOTE — Patient Instructions (Signed)
 To keep you healthy, please keep in mind the following health maintenance items that you are due for:   Health Maintenance Due  Topic Date Due   Hepatitis C Screening  Never done   Pneumococcal Vaccine (1 of 2 - PCV) Never done   Hepatitis B Vaccines 19-59 Average Risk (1 of 3 - 19+ 3-dose series) Never done   HPV VACCINES (1 - 3-dose SCDM series) Never done   Cervical Cancer Screening (HPV/Pap Cotest)  10/12/2023   COVID-19 Vaccine (1 - 2024-25 season) Never done     Best Wishes,   Dr. Lang

## 2024-01-30 NOTE — Progress Notes (Signed)
 Established patient visit   Patient: Amanda [name]   DOB: 05-13-1986   38 y.o. Female  MRN: [id number] Visit Date: 01/30/2024  Today's healthcare provider: Rockie Agent, MD   Chief Complaint  Patient presents with   Medical Management of Chronic Issues    Patient present for follow up of chronic conditions. Reports that she is still having some dizziness, states she tries not to push herself to where she knows she will get dizzy. Would like to know if you did want her to see cardiology- last labs were normal     Subjective     HPI     Medical Management of Chronic Issues    Additional comments: Patient present for follow up of chronic conditions. Reports that she is still having some dizziness, states she tries not to push herself to where she knows she will get dizzy. Would like to know if you did want her to see cardiology- last labs were normal        Last edited by Cherry Chiquita HERO, CMA on 01/30/2024  8:46 AM.       Discussed the use of AI scribe software for clinical note transcription with the patient, who gave verbal consent to proceed.  History of Present Illness Amanda [name] is a 38 year old female who presents with dizziness.  She experiences ongoing episodes of dizziness, described as a sensation of not being grounded, sometimes accompanied by palpitations and a feeling of faintness. These episodes often occur after eating, particularly larger meals, and she has noticed a pattern of feeling unwell after consuming certain foods, such as pizza. She avoids activities that exacerbate the dizziness.  Despite reducing her food intake due to dizziness, she has not experienced significant weight loss and reports being at her heaviest non-postpartum weight. 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  She has experienced dizziness while driving and during physical  activities, such as walking up a hill, which she attributes to possible dehydration. She has been making efforts to stay hydrated. She has not been using any medications for dizziness consistently, although midodrine  was previously suggested.  She reports recent headaches and neck pain following a fall while playing kickball, which she attributes to possibly throwing her neck out of alignment. Chiropractic care provided some relief. No confusion, weakness, or significant changes in urination during these episodes.  Recent workup includes a normal echocardiogram in July, normal blood pressure, normal electrolytes, normal thyroid  function, no anemia, and a normal metabolic panel.     Past Medical History:  Diagnosis Date   Abnormal Pap smear    Anxiety    Post deployment diagnosis   Delayed delivery after SROM (spontaneous rupture of membranes) 04/01/2017   Endometriosis    Heart murmur    Miscarriage    MVP (mitral valve prolapse)    MVP (mitral valve prolapse)--hx, but negative cardiac w/u during pregancy 04/09/2012   SVD (spontaneous vaginal delivery) 10/24/2013    Medications: Outpatient Medications Prior to Visit  Medication Sig   blood glucose meter kit and supplies Dispense based on patient and insurance preference. Use up to four times daily as directed. (FOR ICD-10 E10.9, E11.9).   [DISCONTINUED] COLLAGEN PO Take by mouth. (Patient not taking: Reported on 11/14/2023)   [DISCONTINUED] MAGNESIUM -CALCIUM -FOLIC ACID  PO Take by mouth. (Patient not taking: Reported on 11/14/2023)   [DISCONTINUED] midodrine  (PROAMATINE ) 2.5 MG tablet Take 1  tablet (2.5 mg total) by mouth 3 (three) times daily with meals. (Patient not taking: Reported on 12/14/2023)   [DISCONTINUED] VITAMIN D  PO Take by mouth. (Patient not taking: Reported on 11/14/2023)   [DISCONTINUED] Vitamin D , Ergocalciferol , (DRISDOL ) 1.25 MG (50000 UNIT) CAPS capsule Take 1 capsule (50,000 Units total) by mouth every 7 (seven) days.  (taking one tablet per week) walk in lab in office 1-2 weeks after completing prescription. (Patient not taking: Reported on 11/14/2023)   No facility-administered medications prior to visit.    Review of Systems  Last CBC Lab Results  Component Value Date   WBC 8.6 10/17/2023   HGB 15.4 10/17/2023   HCT 47.8 (H) 10/17/2023   MCV 92 10/17/2023   MCH 29.6 10/17/2023   RDW 12.9 10/17/2023   PLT 398 10/17/2023   Last metabolic panel Lab Results  Component Value Date   GLUCOSE 128 (H) 10/17/2023   NA 139 10/17/2023   K 4.1 10/17/2023   CL 101 10/17/2023   CO2 21 10/17/2023   BUN 14 10/17/2023   CREATININE 0.88 10/17/2023   EGFR 86 10/17/2023   CALCIUM  9.5 10/17/2023   PROT 7.1 10/17/2023   ALBUMIN 4.5 10/17/2023   LABGLOB 2.6 10/17/2023   AGRATIO 2.0 11/02/2022   BILITOT 0.3 10/17/2023   ALKPHOS 86 10/17/2023   AST 19 10/17/2023   ALT 17 10/17/2023   ANIONGAP 6 08/10/2021   Last lipids Lab Results  Component Value Date   CHOL 151 08/02/2021   HDL 69.80 08/02/2021   LDLCALC 59 08/02/2021   TRIG 110.0 08/02/2021   CHOLHDL 2 08/02/2021   Last hemoglobin A1c Lab Results  Component Value Date   HGBA1C 5.4 12/14/2023   Last thyroid  functions Lab Results  Component Value Date   TSH 1.380 10/17/2023   Last vitamin D  Lab Results  Component Value Date   VD25OH 33.7 10/17/2023   Last vitamin B12 and Folate Lab Results  Component Value Date   VITAMINB12 CANCELED 11/02/2022   FOLATE >24.2 08/02/2021        Objective    BP 116/86 (BP Location: Right Arm, Patient Position: Sitting, Cuff Size: Normal)   Pulse 66   Temp 98.1 F (36.7 C) (Oral)   Ht 5' 5 (1.651 m)   Wt 162 lb 8 oz (73.7 kg)   LMP 01/20/2024 (Approximate)   SpO2 100%   Breastfeeding No   BMI 27.04 kg/m  BP Readings from Last 3 Encounters:  01/30/24 116/86  12/14/23 102/89  11/14/23 109/76   Wt Readings from Last 3 Encounters:  01/30/24 162 lb 8 oz (73.7 kg)  12/14/23 160 lb (72.6  kg)  11/14/23 160 lb (72.6 kg)        Physical Exam  Physical Exam VITALS: P- 66, BP- 116/86, SaO2- 100% CARDIOVASCULAR: Regular heart rate and rhythm, S1 and S2 normal without murmurs. NEUROLOGICAL: Cranial nerves grossly intact, pupils equal, round, and reactive to light. Normal gait and heel-to-toe walk. No dysarthria. Alert and oriented to person, place, time, and situation.    No results found for any visits on 01/30/24.  Assessment & Plan     Problem List Items Addressed This Visit     Dizziness   Dizziness and palpitations associated with eating Intermittent dizziness and palpitations, often postprandial. Normal echocardiogram, blood pressure, electrolytes, thyroid  function, and glucose levels. Differential includes neurological issues, POTS, and dumping syndrome. Symptoms do not align with classic POTS. Consider neurological causes and dumping syndrome-like symptoms without prior bariatric surgery.  Possible inadequate intestinal perfusion during meals. Episodes include feeling faint, palpitations, and a sensation of something washing over her. - Refer to cardiology for evaluation of potential cardiac causes. - Refer to neurology for evaluation of potential neurological causes. - Prescribe midodrine  2.5 mg before meals for potential postprandial hypotension. - Consider head imaging if neurological symptoms persist.      Relevant Orders   Ambulatory referral to Cardiology   Other Visit Diagnoses       Multisensory dizziness    -  Primary   Relevant Medications   midodrine  (PROAMATINE ) 2.5 MG tablet   Other Relevant Orders   Ambulatory referral to Neurology   Ambulatory referral to Cardiology     Palpitations in pediatric patient         Dysequilibrium       Relevant Orders   Ambulatory referral to Neurology     Palpitations       Relevant Orders   Ambulatory referral to Cardiology       Assessment & Plan   Overweight by BMI 27.04 Increased weight,  currently at the heaviest non-postpartum weight. Challenges with maintaining a healthy lifestyle due to a busy schedule. Desire to improve health rather than focus solely on weight loss. Difficulty incorporating exercise but aims to enhance overall health and fitness. - Encourage regular physical activity and healthy eating habits. - Advise smaller, more frequent meals to manage symptoms.      Return in about 6 weeks (around 03/12/2024) for Dizziness.         Rockie Agent, MD  [facility] [phone] (phone) [phone] (fax)  [facility]

## 2024-01-30 NOTE — Assessment & Plan Note (Signed)
 Dizziness and palpitations associated with eating Intermittent dizziness and palpitations, often postprandial. Normal echocardiogram, blood pressure, electrolytes, thyroid  function, and glucose levels. Differential includes neurological issues, POTS, and dumping syndrome. Symptoms do not align with classic POTS. Consider neurological causes and dumping syndrome-like symptoms without prior bariatric surgery. Possible inadequate intestinal perfusion during meals. Episodes include feeling faint, palpitations, and a sensation of something washing over her. - Refer to cardiology for evaluation of potential cardiac causes. - Refer to neurology for evaluation of potential neurological causes. - Prescribe midodrine  2.5 mg before meals for potential postprandial hypotension. - Consider head imaging if neurological symptoms persist.

## 2024-03-04 ENCOUNTER — Ambulatory Visit: Payer: Self-pay

## 2024-03-04 NOTE — Telephone Encounter (Signed)
 Patient reports swelling to right side of neck to lymph node. Patient states right lymph node is the size of a marble. Patient endorses having pain with swallowing. Symptoms have been going on for a week. Scheduled for tomorrow at 11 AM with PCP.   FYI Only or Action Required?: FYI only for provider.  Patient was last seen in primary care on 01/30/2024 by Sharma Coyer, MD.  Called Nurse Triage reporting Swollen Glands.  Symptoms began a week ago.  Interventions attempted: Rest, hydration, or home remedies.  Symptoms are: unchanged.  Triage Disposition: See Physician Within 24 Hours  Patient/caregiver understands and will follow disposition?: Yes  Copied from CRM #[id number]. Topic: Clinical - Red Word Triage >> Mar 04, 2024 12:55 PM Hadassah PARAS wrote: Red Word that prompted transfer to Nurse Triage: Swollen glands, hard to swallow, painful; has been feeling like this for over a week, hoping it will go away. Transfering to NT Reason for Disposition  [1] Single large node AND [2] size > 1 inch (2.5 cm) AND [3] no fever  Answer Assessment - Initial Assessment Questions 1. LOCATION: Where is the swollen node located? Is the matching node on the other side of the body also swollen?      Right side of neck node is swollen 2. SIZE: How big is the node? (e.g., inches or centimeters; or compared to common objects such as pea, bean, marble, golf ball)      Size of a marble 3. ONSET: When did the swelling start?      About a week ago 4. NECK NODES: Is there a sore throat, runny nose or other symptoms of a cold?      Hard to swallow, pain  5. GROIN OR ARMPIT NODES: Is there a sore, scratch, cut or painful red area on that arm or leg?      no 6. FEVER: Do you have a fever? If Yes, ask: What is it, how was it measured, and when did it start?      no 7. CAUSE: What do you think is causing the swollen lymph nodes?     unsure 8. OTHER SYMPTOMS: Do you have any other  symptoms? (e.g., node is tender to touch, skin redness over node, weight changes)     Tender to touch 9. PREGNANCY: Is there any chance you are pregnant? When was your last menstrual period?     no  Protocols used: Lymph Nodes - Swollen-A-AH

## 2024-03-04 NOTE — Telephone Encounter (Signed)
 Patient call note and symptoms reviewed. Agree with scheduled appt. Will evaluate during OV

## 2024-03-05 ENCOUNTER — Ambulatory Visit: Admitting: Family Medicine

## 2024-03-05 ENCOUNTER — Encounter: Payer: Self-pay | Admitting: Family Medicine

## 2024-03-05 VITALS — BP 116/81 | HR 75 | Temp 98.1°F | Ht 65.0 in | Wt 165.5 lb

## 2024-03-05 DIAGNOSIS — R591 Generalized enlarged lymph nodes: Secondary | ICD-10-CM

## 2024-03-05 MED ORDER — AMOXICILLIN 875 MG PO TABS: 875.0000 mg | ORAL_TABLET | Freq: Two times a day (BID) | ORAL | 0 refills | Status: AC

## 2024-03-05 NOTE — Progress Notes (Signed)
 ACUTE VISIT   Patient: Amanda [name]   DOB: 1985/07/15   38 y.o. Female  MRN: [id number]   PCP: Sharma Coyer, MD  Chief Complaint  Patient presents with   Adenopathy    Patient reports swelling on right side of neck near lymph node, painful to touch and some pain with swallowing, pressure in right ear. Had this issue in 2022 where there was fluid in eustachian tube, wants to check on this now    Subjective    HPI HPI     Adenopathy    Additional comments: Patient reports swelling on right side of neck near lymph node, painful to touch and some pain with swallowing, pressure in right ear. Had this issue in 2022 where there was fluid in eustachian tube, wants to check on this now       Last edited by Cherry Chiquita HERO, CMA on 03/05/2024 11:00 AM.       Discussed the use of AI scribe software for clinical note transcription with the patient, who gave verbal consent to proceed.  History of Present Illness Amanda [name] is a 38 year old female who presents with right-sided neck pain and difficulty swallowing.  She has been experiencing right-sided neck pain located in the anterior cervical submandibular region, which is painful to touch and causes discomfort when swallowing. The pain is associated with tenderness and swelling, but there is no sore throat or scratchiness. Discomfort is noted in the glands during swallowing.  She has a history of similar symptoms in 2022, which were attributed to fluid in her eustachian tube. An ultrasound at that time showed a 0.6 cm left node. She was diagnosed with eustachian tube dysfunction and is currently concerned about her right ear, which feels pressured.   During the previous episode, she had a sinus infection, which she does not have currently. No current congestion or allergies are reported, despite her children having mild symptoms recently.  Her family history is significant for her mother having had thyroid   cancer, which prompted a thyroid  ultrasound in the past that showed no abnormalities in 2023  Medications: Outpatient Medications Prior to Visit  Medication Sig   blood glucose meter kit and supplies Dispense based on patient and insurance preference. Use up to four times daily as directed. (FOR ICD-10 E10.9, E11.9).   midodrine  (PROAMATINE ) 2.5 MG tablet Please take one tablet by mouth 3 times daily before meals   No facility-administered medications prior to visit.    Last CBC Lab Results  Component Value Date   WBC 8.6 10/17/2023   HGB 15.4 10/17/2023   HCT 47.8 (H) 10/17/2023   MCV 92 10/17/2023   MCH 29.6 10/17/2023   RDW 12.9 10/17/2023   PLT 398 10/17/2023   Last metabolic panel Lab Results  Component Value Date   GLUCOSE 128 (H) 10/17/2023   NA 139 10/17/2023   K 4.1 10/17/2023   CL 101 10/17/2023   CO2 21 10/17/2023   BUN 14 10/17/2023   CREATININE 0.88 10/17/2023   EGFR 86 10/17/2023   CALCIUM  9.5 10/17/2023   PROT 7.1 10/17/2023   ALBUMIN 4.5 10/17/2023   LABGLOB 2.6 10/17/2023   AGRATIO 2.0 11/02/2022   BILITOT 0.3 10/17/2023   ALKPHOS 86 10/17/2023   AST 19 10/17/2023   ALT 17 10/17/2023   ANIONGAP 6 08/10/2021        Objective    BP 116/81 (BP Location: Right Arm, Patient Position: Sitting, Cuff Size: Normal)  Pulse 75   Temp 98.1 F (36.7 C) (Oral)   Ht 5' 5 (1.651 m)   Wt 165 lb 8 oz (75.1 kg)   LMP 02/15/2024 (Exact Date)   SpO2 100%   BMI 27.54 kg/m  BP Readings from Last 3 Encounters:  03/05/24 116/81  01/30/24 116/86  12/14/23 102/89   Wt Readings from Last 3 Encounters:  03/05/24 165 lb 8 oz (75.1 kg)  01/30/24 162 lb 8 oz (73.7 kg)  12/14/23 160 lb (72.6 kg)      Physical Exam   Physical Exam HEENT: Oropharynx non-erythematous, no exudate. Tympanic membranes without effusion, erythema, or bulging. NECK: Right anterior cervical submandibular enlargement, tender, no erythema. Neck with normal range of motion. No neck  stiffness. Right sternocleidomastoid muscle tender, fuller than left.   No results found for any visits on 03/05/24.  Assessment & Plan     Assessment and Plan Assessment & Plan Right cervical lymphadenopathy Acute  Right-sided anterior cervical submandibular enlargement with tenderness extending into the right sternocleidomastoid muscle. No erythema, deformities, or neck stiffness. Normal neck range of motion. Oropharynx non-erythematous, no exudate. Tympanic membranes normal, no effusions or erythema. Symptoms resemble previous eustachian tube dysfunction in 2022. Differential includes bacterial versus viral etiology. - Prescribe amoxicillin  875 mg twice daily for 7 days - Advise to seek emergency care if symptoms worsen, such as difficulty swallowing or breathing - Re-evaluate if symptoms persist after completing antibiotics - Consider imaging if no improvement after antibiotics      Return if symptoms worsen or fail to improve.        Rockie Agent, MD  [facility] [phone] (phone) [phone] (fax)  [facility]

## 2024-03-05 NOTE — Patient Instructions (Signed)
 To keep you healthy, please keep in mind the following health maintenance items that you are due for:   Health Maintenance Due  Topic Date Due   Hepatitis C Screening  Never done   Pneumococcal Vaccine (1 of 2 - PCV) Never done   Hepatitis B Vaccines 19-59 Average Risk (1 of 3 - 19+ 3-dose series) Never done   HPV VACCINES (1 - 3-dose SCDM series) Never done   Cervical Cancer Screening (HPV/Pap Cotest)  10/12/2023   Mammogram  Never done   COVID-19 Vaccine (1 - 2025-26 season) Never done     Best Wishes,   Dr. Lang

## 2024-03-11 ENCOUNTER — Ambulatory Visit: Payer: Self-pay

## 2024-03-11 ENCOUNTER — Other Ambulatory Visit: Payer: Self-pay | Admitting: Family Medicine

## 2024-03-11 DIAGNOSIS — R591 Generalized enlarged lymph nodes: Secondary | ICD-10-CM

## 2024-03-11 MED ORDER — DOXYCYCLINE HYCLATE 100 MG PO TABS: 100.0000 mg | ORAL_TABLET | Freq: Two times a day (BID) | ORAL | 0 refills | Status: AC

## 2024-03-11 NOTE — Telephone Encounter (Signed)
 Doxycycline sent to pharmacy. Patient should start new course for 7 days and if no improvement should be seen in clinic

## 2024-03-11 NOTE — Telephone Encounter (Signed)
 FYI Only or Action Required?: Action required by provider: clinical question for provider.  Patient was last seen in primary care on 03/05/2024 by Sharma Coyer, MD.  Called Nurse Triage reporting Facial Swelling.  Symptoms began a week ago.  Interventions attempted: Prescription medications: Amoxicillin .  Symptoms are: stable.  Triage Disposition: Call PCP When Office is Open  Patient/caregiver understands and will follow disposition?: Unsure   Copied from CRM #[id number]. Topic: Clinical - Red Word Triage >> Mar 11, 2024  8:07 AM Precious C wrote: [facility] that prompted transfer to Nurse Triage: SWELLING  Patient called in stating that she was previously treated for swollen and painful glands. She reported that the prescribed antibiotics initially helped, but the symptoms have returned. She stated that her glands remain swollen and she is still experiencing pain. Answer Assessment - Initial Assessment Questions Patient called in to report, after being seen in office on Wednesday for lymphadenopathy, that her symptoms are not fully improved. She states that the pain with swallowing is not as severe, but she still has a lot of tenderness to the touch in her jaw area. She states it worsens throughout the day as she has to talk more. She also reports having an episode of nausea and feeling woozy last night, but denies vomiting. Denies fever, chills, vomiting since seen in office. Patient denies any difficulty breathing or swallowing. Patient reports having an episode this morning where she briefly felt off balance, and states she had an issue similar to this a few years ago where her eustachian tube was affected and it caused vertigo. She denies any numbness, tingling, or weakness in the body. Patient describes a pressure/feeling pressure pushing up on the bottom of right ear, but states she doesn't have any pain inside of the ear. She denies taking any pain medication, even though  she states last night was the worst discomfort since she was seen in the office last week.  Patient is on her last day of Amoxicillin  and asking for further direction from PCP on what should be done. If she needs to come back in for an appt at this time, please call her at [phone]  Protocols used: No Guideline Available-A-AH

## 2024-03-11 NOTE — Telephone Encounter (Signed)
 Patient advised and verbalized understanding

## 2024-03-25 ENCOUNTER — Encounter: Payer: Self-pay | Admitting: Cardiology

## 2024-03-25 ENCOUNTER — Ambulatory Visit: Attending: Cardiology | Admitting: Cardiology

## 2024-03-25 VITALS — BP 114/78 | HR 84 | Ht 65.0 in | Wt 167.4 lb

## 2024-03-25 DIAGNOSIS — R42 Dizziness and giddiness: Secondary | ICD-10-CM | POA: Diagnosis not present

## 2024-03-25 NOTE — Patient Instructions (Signed)
 Medication Instructions:  Your physician recommends that you continue on your current medications as directed. Please refer to the Current Medication list given to you today.   *If you need a refill on your cardiac medications before your next appointment, please call your pharmacy*  Lab Work: None ordered at this time  If you have labs (blood work) drawn today and your tests are completely normal, you will receive your results only by: MyChart Message (if you have MyChart) OR A paper copy in the mail If you have any lab test that is abnormal or we need to change your treatment, we will call you to review the results.  Testing/Procedures: None ordered at this time   Follow-Up: At [facility], you and your health needs are our priority.  As part of our continuing mission to provide you with exceptional heart care, our providers are all part of one team.  This team includes your primary Cardiologist (physician) and Advanced Practice Providers or APPs (Physician Assistants and Nurse Practitioners) who all work together to provide you with the care you need, when you need it.  Your next appointment:   6 month(s)  Provider:   You may see Dr. Darliss or one of the following Advanced Practice Providers on your designated Care Team:   Lonni Meager, NP Lesley Maffucci, PA-C Bernardino Bring, PA-C Cadence [place], PA-C Tylene Lunch, NP Barnie Hila, NP    We recommend signing up for the patient portal called MyChart.  Sign up information is provided on this After Visit Summary.  MyChart is used to connect with patients for Virtual Visits (Telemedicine).  Patients are able to view lab/test results, encounter notes, upcoming appointments, etc.  Non-urgent messages can be sent to your provider as well.   To learn more about what you can do with MyChart, go to [email or web address].

## 2024-03-25 NOTE — Progress Notes (Signed)
 Cardiology Office Note:    Date:  03/25/2024   ID:  Amanda [name], DOB [birth year], MRN [id number]  PCP:  Sharma Coyer, MD   [facility] Providers Cardiologist:  None     Referring MD: Sharma Bullocks*   Chief Complaint  Patient presents with   New Patient (Initial Visit)    Dizziness and palpitations. Started in may. Has some chest pain with the palpitations.    Amanda [name] is a 38 y.o. female who is being seen today for the evaluation of palpitations at the request of [facility]*.   History of Present Illness:    Amanda [name] is a 38 y.o. female with no significant past medical history who presents due to dizziness.  Endorses dizziness over the past several months, only associated with eating.  Last episode occurred after drinking a milkshake when she suddenly felt dizzy and blurred vision.  She denies syncope.  States getting anxious leading to palpitations.  Previous episode was after eating a potato soup when she felt dizzy, she moved and sat on her couch for a few minutes with resolution of symptoms.  Has noticed symptoms also with eating past tests.  Followed up with primary care physician, echo and coronary CT was obtained, both unrevealing.  Has since tried to stay hydrated, also eating smaller portions, does not exert herself as much.  Symptoms overall seem improved.  She was prescribed midodrine  by primary care physician which she has not started taking.  Echocardiogram 7/25 EF 60 to 65%, diastolic function normal.  No significant valvular abnormalities. Cardiac monitor 7/25 average heart rate 85, no sustained arrhythmias, patient triggered events associated with sinus rhythm, +/- PACs/PVCs   Past Medical History:  Diagnosis Date   Abnormal Pap smear    Anxiety    Post deployment diagnosis   Delayed delivery after SROM (spontaneous rupture of membranes) 04/01/2017   Endometriosis    Miscarriage    SVD (spontaneous  vaginal delivery) 10/24/2013    Past Surgical History:  Procedure Laterality Date   CERVICAL BIOPSY  W/ LOOP ELECTRODE EXCISION  2004   CERVICAL BIOPSY  W/ LOOP ELECTRODE EXCISION     college   WISDOM TOOTH EXTRACTION      Current Medications: Current Meds  Medication Sig   blood glucose meter kit and supplies Dispense based on patient and insurance preference. Use up to four times daily as directed. (FOR ICD-10 E10.9, E11.9).   midodrine  (PROAMATINE ) 2.5 MG tablet Please take one tablet by mouth 3 times daily before meals     Allergies:   Wasp venom   Social History   Socioeconomic History   Marital status: Married    Spouse name: [name]   Number of children: 3   Years of education: college   Highest education level: Associate degree: academic program  Occupational History   Not on file  Tobacco Use   Smoking status: Never   Smokeless tobacco: Never  Vaping Use   Vaping status: Never Used  Substance and Sexual Activity   Alcohol use: No   Drug use: No   Sexual activity: Yes    Birth control/protection: None  Other Topics Concern   Not on file  Social History Narrative   Patient lives at home with her husband ([name]).  Patient has her associates degree. Patient has 3 kids.    Right handed. Caffeine None      Works as oceanographer     Social Drivers of Corporate Investment Banker  Strain: Low Risk  (11/14/2023)   Overall Financial Resource Strain (CARDIA)    Difficulty of Paying Living Expenses: Not hard at all  Food Insecurity: No Food Insecurity (11/14/2023)   Hunger Vital Sign    Worried About Running Out of Food in the Last Year: Never true    Ran Out of Food in the Last Year: Never true  Transportation Needs: No Transportation Needs (11/14/2023)   PRAPARE - Administrator, Civil Service (Medical): No    Lack of Transportation (Non-Medical): No  Physical Activity: Insufficiently Active (11/01/2022)   Exercise Vital Sign    Days of Exercise  per Week: 1 day    Minutes of Exercise per Session: 40 min  Stress: No Stress Concern Present (11/14/2023)   [employer] of Occupational Health - Occupational Stress Questionnaire    Feeling of Stress: Not at all  Social Connections: Moderately Isolated (11/01/2022)   Social Connection and Isolation Panel    Frequency of Communication with Friends and Family: More than three times a week    Frequency of Social Gatherings with Friends and Family: Once a week    Attends Religious Services: Never    Database Administrator or Organizations: No    Attends Engineer, Structural: Not on file    Marital Status: Married     Family History: The patient's family history includes Alcohol abuse in her father; Asthma in her sister; Breast cancer in her maternal grandmother; Cancer in her maternal grandmother and maternal uncle; Diabetes in her maternal grandfather and paternal grandfather; Heart attack in her paternal grandfather; Heart disease in her father and paternal grandfather; Hyperlipidemia in her maternal grandfather and maternal grandmother; Hypertension in her maternal grandfather, mother, and paternal grandfather; Obesity in her mother and sister.  ROS:   Please see the history of present illness.     All other systems reviewed and are negative.  EKGs/Labs/Other Studies Reviewed:    The following studies were reviewed today:  EKG Interpretation Date/Time:  Tuesday March 25 2024 08:24:45 EST Ventricular Rate:  84 PR Interval:  148 QRS Duration:  72 QT Interval:  364 QTC Calculation: 430 R Axis:   6  Text Interpretation: Normal sinus rhythm with sinus arrhythmia Normal ECG Confirmed by Darliss Rogue ([id number]) on 03/25/2024 8:48:52 AM    Recent Labs: 10/17/2023: ALT 17; BUN 14; Creatinine, Ser 0.88; Hemoglobin 15.4; Platelets 398; Potassium 4.1; Sodium 139; TSH 1.380  Recent Lipid Panel    Component Value Date/Time   CHOL 151 08/02/2021 0841   TRIG 110.0  08/02/2021 0841   HDL 69.80 08/02/2021 0841   CHOLHDL 2 08/02/2021 0841   VLDL 22.0 08/02/2021 0841   LDLCALC 59 08/02/2021 0841     Risk Assessment/Calculations:            Physical Exam:    VS:  BP 114/78   Pulse 84   Ht 5' 5 (1.651 m)   Wt 167 lb 6.4 oz (75.9 kg)   LMP 02/15/2024 (Exact Date)   BMI 27.86 kg/m     Wt Readings from Last 3 Encounters:  03/25/24 167 lb 6.4 oz (75.9 kg)  03/05/24 165 lb 8 oz (75.1 kg)  01/30/24 162 lb 8 oz (73.7 kg)     GEN:  Well nourished, well developed in no acute distress HEENT: Normal NECK: No JVD; No carotid bruits CARDIAC: RRR, no murmurs, rubs, gallops RESPIRATORY:  Clear to auscultation without rales, wheezing or rhonchi  ABDOMEN: Soft, non-tender,  non-distended MUSCULOSKELETAL:  No edema; No deformity  SKIN: Warm and dry NEUROLOGIC:  Alert and oriented x 3 PSYCHIATRIC:  Normal affect   ASSESSMENT:    1. Dizziness    PLAN:    In order of problems listed above:  Dizziness, orthostatic vitals today with no evidence of orthostasis.  Echo and cardiac monitor obtained 11/2023 unrevealing.  Dizziness appears postprandial.  Advised to eat smaller more frequent meals.  Avoid high sugar or processed carbohydrates.  Drink plenty of fluids before and during meals.  Follow-up in 6 months.     Medication Adjustments/Labs and Tests Ordered: Current medicines are reviewed at length with the patient today.  Concerns regarding medicines are outlined above.  Orders Placed This Encounter  Procedures   EKG 12-Lead   No orders of the defined types were placed in this encounter.   Patient Instructions  Medication Instructions:  Your physician recommends that you continue on your current medications as directed. Please refer to the Current Medication list given to you today.   *If you need a refill on your cardiac medications before your next appointment, please call your pharmacy*  Lab Work: None ordered at this time  If you  have labs (blood work) drawn today and your tests are completely normal, you will receive your results only by: MyChart Message (if you have MyChart) OR A paper copy in the mail If you have any lab test that is abnormal or we need to change your treatment, we will call you to review the results.  Testing/Procedures: None ordered at this time   Follow-Up: At [facility], you and your health needs are our priority.  As part of our continuing mission to provide you with exceptional heart care, our providers are all part of one team.  This team includes your primary Cardiologist (physician) and Advanced Practice Providers or APPs (Physician Assistants and Nurse Practitioners) who all work together to provide you with the care you need, when you need it.  Your next appointment:   6 month(s)  Provider:   You may see Dr. Darliss or one of the following Advanced Practice Providers on your designated Care Team:   Lonni Meager, NP Lesley Maffucci, PA-C Bernardino Bring, PA-C Cadence Edmondson, PA-C Tylene Lunch, NP Barnie Hila, NP    We recommend signing up for the patient portal called MyChart.  Sign up information is provided on this After Visit Summary.  MyChart is used to connect with patients for Virtual Visits (Telemedicine).  Patients are able to view lab/test results, encounter notes, upcoming appointments, etc.  Non-urgent messages can be sent to your provider as well.   To learn more about what you can do with MyChart, go to [email or web address].     Signed, Redell Darliss, MD  03/25/2024 10:44 AM    [facility]

## 2024-03-27 ENCOUNTER — Ambulatory Visit: Admitting: Family Medicine

## 2024-04-08 ENCOUNTER — Ambulatory Visit: Payer: Self-pay

## 2024-04-08 NOTE — Telephone Encounter (Signed)
 Patient call note and symptoms reviewed. Agree with scheduled appt. Will evaluate during OV

## 2024-04-08 NOTE — Telephone Encounter (Signed)
 FYI Only or Action Required?: FYI only for provider: appointment scheduled on 11/19.  Patient was last seen in primary care on 03/05/2024 by Sharma Coyer, MD.  Called Nurse Triage reporting Oral Pain.  Symptoms began yesterday.  Interventions attempted: Nothing.  Symptoms are: gradually worsening.  Triage Disposition: See PCP Within 2 Weeks  Patient/caregiver understands and will follow disposition?: Yes, will follow disposition  Copied from CRM #[id number]. Topic: Clinical - Red Word Triage >> Apr 08, 2024  8:04 AM Mia F wrote: Red Word that prompted transfer to Nurse Triage: Swollen gland under the jaw causing pain and difficulty opening her mouth and to swallow. This has happened twice over the last couple of months and was given an antibiotic and now it is back again. PT is concerned as she leaves to go out of town soon Reason for Disposition  All other mouth symptoms  (Exceptions: Dry mouth from not drinking enough liquids, chapped lips.)  Answer Assessment - Initial Assessment Questions 1. SYMPTOM: What's the main symptom you're concerned about? (e.g., chapped lips, dry mouth, lump, sores)     Swelling in the lymph nodes in chin area 2. ONSET: When did the  swelling  start?     yesterday 3. PAIN: Is there any pain? If Yes, ask: How bad is it? (Scale: 0-10; or none, mild, moderate, severe)     mild 4. CAUSE: What do you think is causing the symptoms?     unsure 5. OTHER SYMPTOMS: Do you have any other symptoms? (e.g., fever, sore throat, toothache, swelling)     denies 6. PREGNANCY: Is there any chance you are pregnant? When was your last menstrual period?     denies  Protocols used: Mouth Symptoms-A-AH

## 2024-04-09 ENCOUNTER — Other Ambulatory Visit: Payer: Self-pay | Admitting: Physician Assistant

## 2024-04-09 ENCOUNTER — Ambulatory Visit: Admitting: Family Medicine

## 2024-04-09 ENCOUNTER — Encounter: Payer: Self-pay | Admitting: Family Medicine

## 2024-04-09 VITALS — BP 112/84 | HR 74 | Temp 97.7°F | Ht 65.0 in | Wt 168.2 lb

## 2024-04-09 DIAGNOSIS — R6884 Jaw pain: Secondary | ICD-10-CM

## 2024-04-09 DIAGNOSIS — R591 Generalized enlarged lymph nodes: Secondary | ICD-10-CM | POA: Diagnosis not present

## 2024-04-09 DIAGNOSIS — H519 Unspecified disorder of binocular movement: Secondary | ICD-10-CM

## 2024-04-09 DIAGNOSIS — R42 Dizziness and giddiness: Secondary | ICD-10-CM

## 2024-04-09 MED ORDER — PREDNISONE 20 MG PO TABS: 20.0000 mg | ORAL_TABLET | Freq: Every day | ORAL | 0 refills | Status: AC

## 2024-04-09 MED ORDER — DOXYCYCLINE HYCLATE 100 MG PO TABS: 100.0000 mg | ORAL_TABLET | Freq: Two times a day (BID) | ORAL | 0 refills | Status: AC

## 2024-04-09 NOTE — Progress Notes (Signed)
 Established patient visit   Patient: Amanda [name]   DOB: [birth year]   38 y.o. Female  MRN: [id number] Visit Date: 04/09/2024  Today's healthcare provider: Rockie Agent, MD   Chief Complaint  Patient presents with   Lymphadenopathy    Patient reports gland under jaw swelling x 2 days. Makes it difficult to open mouth and swallow, mild pain. Patient has had this happen a few times over past few months. Right side    Subjective     HPI     Lymphadenopathy    Additional comments: Patient reports gland under jaw swelling x 2 days. Makes it difficult to open mouth and swallow, mild pain. Patient has had this happen a few times over past few months. Right side       Last edited by Cherry Chiquita HERO, CMA on 04/09/2024  8:09 AM.       Discussed the use of AI scribe software for clinical note transcription with the patient, who gave verbal consent to proceed.  History of Present Illness Amanda [name] is a 38 year old female who presents with recurrent swelling under her right mandible.  She has been experiencing swelling under her right mandible for the past two days, causing difficulty in opening her mouth and swallowing. The pain worsens throughout the day with activities such as talking and eating. This swelling has recurred several times over the past few months.  In October, she was treated with amoxicillin  875 mg for seven days, followed by doxycycline for a recurrence. Despite these treatments, the swelling has returned, starting this past Monday evening. She is concerned about managing the condition as she plans to travel to [place]  next week.  The swelling is initially tender under the jaw, with pressure sometimes extending up under her ear. She notes a clicking sound when she moves her jaw or presses on the area. No fever or recent cold symptoms, although she routinely clears her nose in the mornings.  She recalls a similar episode in 2023, for which she  was evaluated by an ear, nose, and throat specialist who found fluid in her eustachian tubes but no significant issues. She last visited her dentist six months ago.  No fever, and swallowing is currently okay, though tender. She has not been battling a cold and her children are not currently sick.     Past Medical History:  Diagnosis Date   Abnormal Pap smear    Anxiety    Post deployment diagnosis   Delayed delivery after SROM (spontaneous rupture of membranes) 04/01/2017   Endometriosis    Miscarriage    SVD (spontaneous vaginal delivery) 10/24/2013    Medications: Outpatient Medications Prior to Visit  Medication Sig   blood glucose meter kit and supplies Dispense based on patient and insurance preference. Use up to four times daily as directed. (FOR ICD-10 E10.9, E11.9).   midodrine  (PROAMATINE ) 2.5 MG tablet Please take one tablet by mouth 3 times daily before meals   No facility-administered medications prior to visit.    Review of Systems  Last CBC Lab Results  Component Value Date   WBC 8.6 10/17/2023   HGB 15.4 10/17/2023   HCT 47.8 (H) 10/17/2023   MCV 92 10/17/2023   MCH 29.6 10/17/2023   RDW 12.9 10/17/2023   PLT 398 10/17/2023   Last metabolic panel Lab Results  Component Value Date   GLUCOSE 128 (H) 10/17/2023   NA 139 10/17/2023   K 4.1 10/17/2023  CL 101 10/17/2023   CO2 21 10/17/2023   BUN 14 10/17/2023   CREATININE 0.88 10/17/2023   EGFR 86 10/17/2023   CALCIUM  9.5 10/17/2023   PROT 7.1 10/17/2023   ALBUMIN 4.5 10/17/2023   LABGLOB 2.6 10/17/2023   AGRATIO 2.0 11/02/2022   BILITOT 0.3 10/17/2023   ALKPHOS 86 10/17/2023   AST 19 10/17/2023   ALT 17 10/17/2023   ANIONGAP 6 08/10/2021   Last lipids Lab Results  Component Value Date   CHOL 151 08/02/2021   HDL 69.80 08/02/2021   LDLCALC 59 08/02/2021   TRIG 110.0 08/02/2021   CHOLHDL 2 08/02/2021   Last hemoglobin A1c Lab Results  Component Value Date   HGBA1C 5.4 12/14/2023    Last thyroid  functions Lab Results  Component Value Date   TSH 1.380 10/17/2023   FREET4 1.21 11/02/2022   Last vitamin D  Lab Results  Component Value Date   VD25OH 33.7 10/17/2023   Last vitamin B12 and Folate Lab Results  Component Value Date   VITAMINB12 CANCELED 11/02/2022   FOLATE >24.2 08/02/2021        Objective    BP 112/84 (BP Location: Right Arm, Patient Position: Sitting, Cuff Size: Normal)   Pulse 74   Temp 97.7 F (36.5 C) (Oral)   Ht 5' 5 (1.651 m)   Wt 168 lb 3.2 oz (76.3 kg)   LMP 04/06/2024 (Exact Date)   SpO2 100%   BMI 27.99 kg/m   BP Readings from Last 3 Encounters:  04/09/24 112/84  03/25/24 114/78  03/05/24 116/81   Wt Readings from Last 3 Encounters:  04/09/24 168 lb 3.2 oz (76.3 kg)  03/25/24 167 lb 6.4 oz (75.9 kg)  03/05/24 165 lb 8 oz (75.1 kg)        Physical Exam Constitutional:      General: She is not in acute distress.    Appearance: She is not ill-appearing.  Pulmonary:     Effort: Pulmonary effort is normal.  Musculoskeletal:     Cervical back: Normal range of motion and neck supple. Tenderness present. No erythema, signs of trauma or rigidity. Pain with movement present. Normal range of motion.  Lymphadenopathy:     Cervical: Cervical adenopathy present.     Right cervical: Superficial cervical adenopathy present. No posterior cervical adenopathy. Neurological:     Mental Status: She is alert.       No results found for any visits on 04/09/24.  Assessment & Plan     Problem List Items Addressed This Visit   None Visit Diagnoses       LAD (lymphadenopathy)    -  Primary   Relevant Medications   doxycycline (VIBRA-TABS) 100 MG tablet   predniSONE  (DELTASONE ) 20 MG tablet (Start on 04/18/2024)   Other Relevant Orders   CT SOFT TISSUE NECK W CONTRAST     Mandibular pain       Relevant Medications   predniSONE  (DELTASONE ) 20 MG tablet (Start on 04/18/2024)   Other Relevant Orders   CT SOFT TISSUE NECK  W CONTRAST       Assessment and Plan Assessment & Plan Right submandibular gland swelling and jaw pain Recurrent right submandibular gland swelling and jaw pain for two days, with difficulty opening mouth and swallowing. Previous episodes treated with amoxicillin  and doxycycline. No fever or recent illness. Differential includes lymph node involvement or other underlying pathology. Imaging warranted due to recurrence and lack of clear etiology. - Ordered CT of the neck and  soft tissue with contrast  to evaluate right mandibular area. - Prescribed doxycycline 100 mg daily for 10 days. - Prescribed prednisone  20 mg daily to start after 10 days of doxycycline if symptoms persist.     Return in about 2 weeks (around 04/23/2024), or if symptoms worsen or fail to improve.         Rockie Agent, MD  [facility] [phone] (phone) [phone] (fax)  [facility]

## 2024-04-09 NOTE — Patient Instructions (Signed)
 To keep you healthy, please keep in mind the following health maintenance items that you are due for:   Health Maintenance Due  Topic Date Due   Hepatitis C Screening  Never done   Pneumococcal Vaccine (1 of 2 - PCV) Never done   Hepatitis B Vaccines 19-59 Average Risk (1 of 3 - 19+ 3-dose series) Never done   HPV VACCINES (1 - 3-dose SCDM series) Never done   Cervical Cancer Screening (HPV/Pap Cotest)  10/12/2023   Mammogram  Never done     Best Wishes,   Dr. Lang

## 2024-04-11 ENCOUNTER — Ambulatory Visit
Admission: RE | Admit: 2024-04-11 | Discharge: 2024-04-11 | Disposition: A | Discharge status: Home / Self Care | Source: Ambulatory Visit | Attending: Family Medicine | Admitting: Family Medicine

## 2024-04-11 DIAGNOSIS — R6884 Jaw pain: Secondary | ICD-10-CM | POA: Insufficient documentation

## 2024-04-11 DIAGNOSIS — R591 Generalized enlarged lymph nodes: Secondary | ICD-10-CM | POA: Insufficient documentation

## 2024-04-11 MED ORDER — IOHEXOL 300 MG/ML  SOLN
75.0000 mL | Freq: Once | INTRAMUSCULAR | Status: AC | PRN
  Administered 2024-04-11: 75 mL via INTRAVENOUS

## 2024-04-11 MED ORDER — SODIUM CHLORIDE 0.9 % IV SOLN: INTRAVENOUS | Status: DC

## 2024-04-12 ENCOUNTER — Ambulatory Visit
Admission: RE | Admit: 2024-04-12 | Discharge: 2024-04-12 | Disposition: A | Discharge status: Home / Self Care | Source: Ambulatory Visit | Attending: Physician Assistant | Admitting: Physician Assistant

## 2024-04-12 DIAGNOSIS — R42 Dizziness and giddiness: Secondary | ICD-10-CM | POA: Insufficient documentation

## 2024-04-12 DIAGNOSIS — H519 Unspecified disorder of binocular movement: Secondary | ICD-10-CM | POA: Insufficient documentation

## 2024-04-16 ENCOUNTER — Ambulatory Visit: Payer: Self-pay | Admitting: Family Medicine

## 2024-05-28 ENCOUNTER — Ambulatory Visit: Admitting: Family Medicine

## 2024-05-28 ENCOUNTER — Encounter: Payer: Self-pay | Admitting: Family Medicine

## 2024-05-28 VITALS — BP 104/65 | HR 79 | Resp 14 | Ht 65.0 in | Wt 171.4 lb

## 2024-05-28 DIAGNOSIS — I9589 Other hypotension: Secondary | ICD-10-CM | POA: Diagnosis not present

## 2024-05-28 NOTE — Progress Notes (Signed)
 "  Established Patient Office Visit  Patient ID: Amanda [name], female    DOB: [birth year]  Age: 39 y.o. MRN: [id number] PCP: Sharma Coyer, MD  Chief Complaint  Patient presents with   Follow-up    6 week f/u dizziness. Pt reports doing much, has seen cardiology and neurology. Pt has stopped taking medication given for her dizzy.  Mammogram record requested, done 12/04/23    Subjective:     HPI  Discussed the use of AI scribe software for clinical note transcription with the patient, who gave verbal consent to proceed.  History of Present Illness Amanda [name] is a 39 year old female who presents for a six-week follow-up for dizziness.  Amanda [name] has experienced significant improvement in her dizziness since her last visit. Amanda [name] reports that Amanda [name] saw both cardiology and neurology, and was told by the cardiologist that everything looked fine structurally and there were no concerns. Amanda [name] has stopped taking the medication previously prescribed for dizziness and is managing her symptoms through dietary changes, such as portion control and spreading carbohydrate intake throughout the day. Amanda [name] has increased her intake of lean proteins and vegetables, which Amanda [name] finds beneficial.  Amanda [name] maintains hydration and has switched to a lower caffeine coffee, which Amanda [name] feels has reduced her afternoon energy crashes. Amanda [name] has started walking on the treadmill at a fast pace, aiming for at least a mile and a half daily, and reports feeling better overall.  Amanda [name] is actively meal prepping and planning with her husband to support healthier eating habits. Amanda [name] aims to incorporate running into her routine by March, as her husband is recovering from an Achilles injury.  Amanda [name] has not started the medication midodrine , preferring to manage her symptoms through lifestyle changes. No dizziness after meals when adhering to her dietary adjustments.   Patient Active Problem List   Diagnosis Date Noted   Dizziness  12/14/2023   Other fatigue 11/02/2022   Irritable bowel syndrome 01/21/2020   Vitamin D  deficiency 08/16/2019   History of abnormal cervical Papanicolaou smear 09/07/2016      ROS    Objective:     BP 104/65   Pulse 79   Resp 14   Ht 5' 5 (1.651 m)   Wt 171 lb 6.4 oz (77.7 kg)   LMP 05/27/2024   SpO2 100%   BMI 28.52 kg/m  BP Readings from Last 3 Encounters:  05/28/24 104/65  04/09/24 112/84  03/25/24 114/78   Wt Readings from Last 3 Encounters:  05/28/24 171 lb 6.4 oz (77.7 kg)  04/09/24 168 lb 3.2 oz (76.3 kg)  03/25/24 167 lb 6.4 oz (75.9 kg)      Physical Exam Vitals reviewed.  Constitutional:      General: Amanda [name] is not in acute distress.    Appearance: Normal appearance. Amanda [name] is not ill-appearing.  Cardiovascular:     Rate and Rhythm: Normal rate and regular rhythm.  Pulmonary:     Effort: Pulmonary effort is normal. No respiratory distress.     Breath sounds: No wheezing, rhonchi or rales.  Neurological:     Mental Status: Amanda [name] is alert and oriented to person, place, and time.  Psychiatric:        Mood and Affect: Mood normal.        Behavior: Behavior normal.      No results found for any visits on 05/28/24.  Last CBC Lab Results  Component Value Date   WBC 8.6 10/17/2023   HGB 15.4 10/17/2023   HCT  47.8 (H) 10/17/2023   MCV 92 10/17/2023   MCH 29.6 10/17/2023   RDW 12.9 10/17/2023   PLT 398 10/17/2023   Last metabolic panel Lab Results  Component Value Date   GLUCOSE 128 (H) 10/17/2023   NA 139 10/17/2023   K 4.1 10/17/2023   CL 101 10/17/2023   CO2 21 10/17/2023   BUN 14 10/17/2023   CREATININE 0.88 10/17/2023   EGFR 86 10/17/2023   CALCIUM  9.5 10/17/2023   PROT 7.1 10/17/2023   ALBUMIN 4.5 10/17/2023   LABGLOB 2.6 10/17/2023   AGRATIO 2.0 11/02/2022   BILITOT 0.3 10/17/2023   ALKPHOS 86 10/17/2023   AST 19 10/17/2023   ALT 17 10/17/2023   ANIONGAP 6 08/10/2021   Last lipids Lab Results  Component Value Date   CHOL 151  08/02/2021   HDL 69.80 08/02/2021   LDLCALC 59 08/02/2021   TRIG 110.0 08/02/2021   CHOLHDL 2 08/02/2021   Last hemoglobin A1c Lab Results  Component Value Date   HGBA1C 5.4 12/14/2023   Last thyroid  functions Lab Results  Component Value Date   TSH 1.380 10/17/2023   FREET4 1.21 11/02/2022      The 10-year ASCVD risk score (Arnett DK, et al., 2019) is: 0.1%    Assessment & Plan:   Problem List Items Addressed This Visit   None Visit Diagnoses       Postprandial hypotension    -  Primary       Assessment and Plan Assessment & Plan Postprandial hypotension Symptoms have improved significantly with dietary modifications and increased hydration. Cardiologist and neurologist evaluations were normal, with no structural heart issues or neurological abnormalities. Cardiologist suggested postprandial syndrome as a possible cause. Amanda [name] has been adjusting her diet to smaller, more frequent meals with lean proteins and vegetables, and has increased water intake. Amanda [name] has also switched to a mushroom coffee with less caffeine, which has helped with energy levels. Blood pressure is well-controlled with current lifestyle changes. Amanda [name] has not started midodrine  as Amanda [name] prefers to manage symptoms through diet and lifestyle changes. - Continue dietary modifications with smaller, more frequent meals and increased lean proteins and vegetables. - Maintain adequate hydration. - Continue current exercise regimen, including walking on the treadmill and meal planning with her husband. - Consider keeping midodrine  on hand for potential future use if dietary changes are insufficient.  General Health Maintenance Amanda [name] is due for a Pap smear and has been advised to schedule it. Amanda [name] is also engaging in regular physical activity and dietary changes to improve overall health. - Schedule Pap smear. - Continue regular physical activity, aiming for 200-240 minutes of moderate intensity exercise per week. -  Incorporate resistance training to maintain muscle mass.    Return in about 6 months (around 11/25/2024) for CPE.    Rockie Agent, MD [facility]   "

## 2024-05-28 NOTE — Patient Instructions (Signed)
 To keep you healthy, please keep in mind the following health maintenance items that you are due for:   Health Maintenance Due  Topic Date Due   Hepatitis C Screening  Never done   Pneumococcal Vaccine (1 of 2 - PCV) Never done   Hepatitis B Vaccines 19-59 Average Risk (1 of 3 - 19+ 3-dose series) Never done   HPV VACCINES (1 - 3-dose SCDM series) Never done   Cervical Cancer Screening (HPV/Pap Cotest)  10/12/2023   Mammogram  Never done   COVID-19 Vaccine (1 - 2025-26 season) Never done     Best Wishes,   Dr. Lang

## 2024-11-26 ENCOUNTER — Encounter: Admitting: Family Medicine
